# Patient Record
Sex: Female | Born: 1975 | Race: White | Hispanic: No | Smoking: Current every day smoker
Health system: Southern US, Community
[De-identification: ages and names within clinical notes are randomized; demographics above are authoritative.]

## PROBLEM LIST (undated history)

## (undated) DIAGNOSIS — M797 Fibromyalgia: Secondary | ICD-10-CM

## (undated) DIAGNOSIS — Q780 Osteogenesis imperfecta: Secondary | ICD-10-CM

## (undated) DIAGNOSIS — E282 Polycystic ovarian syndrome: Secondary | ICD-10-CM

## (undated) DIAGNOSIS — M8580 Other specified disorders of bone density and structure, unspecified site: Secondary | ICD-10-CM

## (undated) DIAGNOSIS — M419 Scoliosis, unspecified: Secondary | ICD-10-CM

## (undated) HISTORY — PX: CHOLECYSTECTOMY: SHX55

## (undated) HISTORY — PX: KNEE SURGERY: SHX244

---

## 1998-12-21 DIAGNOSIS — E669 Obesity, unspecified: Secondary | ICD-10-CM | POA: Insufficient documentation

## 2000-07-15 ENCOUNTER — Other Ambulatory Visit: Admission: RE | Admit: 2000-07-15 | Discharge: 2000-07-15 | Payer: Self-pay | Admitting: *Deleted

## 2002-03-23 ENCOUNTER — Emergency Department (HOSPITAL_COMMUNITY): Admission: EM | Admit: 2002-03-23 | Discharge: 2002-03-23 | Payer: Self-pay | Admitting: Emergency Medicine

## 2002-03-23 ENCOUNTER — Encounter: Payer: Self-pay | Admitting: Emergency Medicine

## 2003-01-10 ENCOUNTER — Inpatient Hospital Stay (HOSPITAL_COMMUNITY): Admission: AD | Admit: 2003-01-10 | Discharge: 2003-01-10 | Payer: Self-pay | Admitting: Obstetrics & Gynecology

## 2003-06-10 ENCOUNTER — Emergency Department (HOSPITAL_COMMUNITY): Admission: EM | Admit: 2003-06-10 | Discharge: 2003-06-11 | Payer: Self-pay

## 2004-12-20 DIAGNOSIS — M87059 Idiopathic aseptic necrosis of unspecified femur: Secondary | ICD-10-CM | POA: Insufficient documentation

## 2005-10-27 ENCOUNTER — Emergency Department (HOSPITAL_COMMUNITY): Admission: EM | Admit: 2005-10-27 | Discharge: 2005-10-27 | Payer: Self-pay | Admitting: Emergency Medicine

## 2005-12-06 ENCOUNTER — Emergency Department (HOSPITAL_COMMUNITY): Admission: EM | Admit: 2005-12-06 | Discharge: 2005-12-06 | Payer: Self-pay | Admitting: Emergency Medicine

## 2006-01-06 ENCOUNTER — Emergency Department (HOSPITAL_COMMUNITY): Admission: EM | Admit: 2006-01-06 | Discharge: 2006-01-07 | Payer: Self-pay | Admitting: Emergency Medicine

## 2006-01-12 ENCOUNTER — Ambulatory Visit (HOSPITAL_COMMUNITY): Admission: RE | Admit: 2006-01-12 | Discharge: 2006-01-12 | Payer: Self-pay | Admitting: Obstetrics & Gynecology

## 2006-01-12 ENCOUNTER — Encounter: Payer: Self-pay | Admitting: Vascular Surgery

## 2006-03-08 ENCOUNTER — Observation Stay (HOSPITAL_COMMUNITY): Admission: AD | Admit: 2006-03-08 | Discharge: 2006-03-09 | Payer: Self-pay | Admitting: Obstetrics & Gynecology

## 2006-03-11 ENCOUNTER — Inpatient Hospital Stay (HOSPITAL_COMMUNITY): Admission: AD | Admit: 2006-03-11 | Discharge: 2006-03-14 | Payer: Self-pay | Admitting: Obstetrics

## 2006-04-21 ENCOUNTER — Encounter: Admission: RE | Admit: 2006-04-21 | Discharge: 2006-07-01 | Payer: Self-pay | Admitting: Sports Medicine

## 2006-07-05 ENCOUNTER — Encounter: Admission: RE | Admit: 2006-07-05 | Discharge: 2006-07-05 | Payer: Self-pay | Admitting: Rheumatology

## 2006-07-12 ENCOUNTER — Encounter: Admission: RE | Admit: 2006-07-12 | Discharge: 2006-07-12 | Payer: Self-pay | Admitting: Rheumatology

## 2007-01-17 ENCOUNTER — Emergency Department (HOSPITAL_COMMUNITY): Admission: EM | Admit: 2007-01-17 | Discharge: 2007-01-17 | Payer: Self-pay | Admitting: Emergency Medicine

## 2007-01-19 ENCOUNTER — Emergency Department (HOSPITAL_COMMUNITY): Admission: EM | Admit: 2007-01-19 | Discharge: 2007-01-19 | Payer: Self-pay | Admitting: Emergency Medicine

## 2007-04-11 ENCOUNTER — Emergency Department (HOSPITAL_COMMUNITY): Admission: EM | Admit: 2007-04-11 | Discharge: 2007-04-11 | Payer: Self-pay | Admitting: Emergency Medicine

## 2007-11-27 ENCOUNTER — Emergency Department (HOSPITAL_COMMUNITY): Admission: EM | Admit: 2007-11-27 | Discharge: 2007-11-28 | Payer: Self-pay | Admitting: Emergency Medicine

## 2008-01-12 ENCOUNTER — Emergency Department (HOSPITAL_BASED_OUTPATIENT_CLINIC_OR_DEPARTMENT_OTHER): Admission: EM | Admit: 2008-01-12 | Discharge: 2008-01-12 | Payer: Self-pay | Admitting: Emergency Medicine

## 2008-05-28 ENCOUNTER — Emergency Department (HOSPITAL_BASED_OUTPATIENT_CLINIC_OR_DEPARTMENT_OTHER): Admission: EM | Admit: 2008-05-28 | Discharge: 2008-05-28 | Payer: Self-pay | Admitting: Emergency Medicine

## 2008-11-14 ENCOUNTER — Emergency Department (HOSPITAL_BASED_OUTPATIENT_CLINIC_OR_DEPARTMENT_OTHER): Admission: EM | Admit: 2008-11-14 | Discharge: 2008-11-14 | Payer: Self-pay | Admitting: Emergency Medicine

## 2009-01-14 ENCOUNTER — Emergency Department (HOSPITAL_BASED_OUTPATIENT_CLINIC_OR_DEPARTMENT_OTHER): Admission: EM | Admit: 2009-01-14 | Discharge: 2009-01-14 | Payer: Self-pay | Admitting: Emergency Medicine

## 2009-02-04 ENCOUNTER — Emergency Department (HOSPITAL_BASED_OUTPATIENT_CLINIC_OR_DEPARTMENT_OTHER): Admission: EM | Admit: 2009-02-04 | Discharge: 2009-02-04 | Payer: Self-pay | Admitting: Emergency Medicine

## 2009-02-27 ENCOUNTER — Ambulatory Visit: Payer: Self-pay | Admitting: Radiology

## 2009-02-27 ENCOUNTER — Emergency Department (HOSPITAL_BASED_OUTPATIENT_CLINIC_OR_DEPARTMENT_OTHER): Admission: EM | Admit: 2009-02-27 | Discharge: 2009-02-27 | Payer: Self-pay | Admitting: Emergency Medicine

## 2010-03-05 ENCOUNTER — Emergency Department (HOSPITAL_COMMUNITY): Admission: EM | Admit: 2010-03-05 | Discharge: 2010-03-06 | Payer: Self-pay | Admitting: Emergency Medicine

## 2010-04-08 ENCOUNTER — Emergency Department (HOSPITAL_BASED_OUTPATIENT_CLINIC_OR_DEPARTMENT_OTHER): Admission: EM | Admit: 2010-04-08 | Discharge: 2010-04-08 | Payer: Self-pay | Admitting: Emergency Medicine

## 2010-04-08 ENCOUNTER — Ambulatory Visit: Payer: Self-pay | Admitting: Diagnostic Radiology

## 2010-08-22 LAB — URINALYSIS, ROUTINE W REFLEX MICROSCOPIC
Nitrite: NEGATIVE
Protein, ur: NEGATIVE mg/dL
Specific Gravity, Urine: 1.024 (ref 1.005–1.030)
Urobilinogen, UA: 1 mg/dL (ref 0.0–1.0)

## 2010-08-22 LAB — URINE MICROSCOPIC-ADD ON

## 2010-08-22 LAB — PREGNANCY, URINE: Preg Test, Ur: NEGATIVE

## 2010-09-01 LAB — URINE MICROSCOPIC-ADD ON

## 2010-09-01 LAB — URINALYSIS, ROUTINE W REFLEX MICROSCOPIC
Glucose, UA: NEGATIVE mg/dL
Hgb urine dipstick: NEGATIVE
Ketones, ur: NEGATIVE mg/dL
pH: 5.5 (ref 5.0–8.0)

## 2011-02-12 LAB — POCT I-STAT, CHEM 8
Creatinine, Ser: 1.1
HCT: 38
Hemoglobin: 12.9
Sodium: 142
TCO2: 29

## 2011-02-12 LAB — URINALYSIS, ROUTINE W REFLEX MICROSCOPIC
Bilirubin Urine: NEGATIVE
Ketones, ur: NEGATIVE
Nitrite: NEGATIVE
Urobilinogen, UA: 1

## 2011-02-12 LAB — DIFFERENTIAL
Basophils Absolute: 0.3 — ABNORMAL HIGH
Basophils Relative: 2 — ABNORMAL HIGH
Eosinophils Absolute: 0.1
Eosinophils Relative: 1
Lymphs Abs: 3

## 2011-02-12 LAB — CBC
HCT: 35.9 — ABNORMAL LOW
MCHC: 34.8
MCV: 87.4
Platelets: 328
RDW: 12.5

## 2011-02-27 LAB — BASIC METABOLIC PANEL
Calcium: 8.9
GFR calc non Af Amer: >60
Glucose, Bld: 88
Sodium: 137

## 2011-02-27 LAB — DIFFERENTIAL
Basophils Absolute: 0
Lymphocytes Relative: 20
Monocytes Absolute: 0.5
Neutro Abs: 8.5 — ABNORMAL HIGH

## 2011-02-27 LAB — CBC
Hemoglobin: 12.8
Platelets: 343
RDW: 12.3

## 2011-05-14 ENCOUNTER — Encounter: Payer: Self-pay | Admitting: *Deleted

## 2011-05-14 ENCOUNTER — Emergency Department (INDEPENDENT_AMBULATORY_CARE_PROVIDER_SITE_OTHER): Payer: Medicaid Other

## 2011-05-14 ENCOUNTER — Emergency Department (HOSPITAL_BASED_OUTPATIENT_CLINIC_OR_DEPARTMENT_OTHER)
Admission: EM | Admit: 2011-05-14 | Discharge: 2011-05-14 | Disposition: A | Discharge status: Home / Self Care | Payer: Medicaid Other | Attending: Emergency Medicine | Admitting: Emergency Medicine

## 2011-05-14 DIAGNOSIS — R0602 Shortness of breath: Secondary | ICD-10-CM | POA: Insufficient documentation

## 2011-05-14 DIAGNOSIS — R071 Chest pain on breathing: Secondary | ICD-10-CM

## 2011-05-14 DIAGNOSIS — R079 Chest pain, unspecified: Secondary | ICD-10-CM | POA: Insufficient documentation

## 2011-05-14 DIAGNOSIS — M549 Dorsalgia, unspecified: Secondary | ICD-10-CM | POA: Insufficient documentation

## 2011-05-14 DIAGNOSIS — M7989 Other specified soft tissue disorders: Secondary | ICD-10-CM | POA: Insufficient documentation

## 2011-05-14 LAB — URINE MICROSCOPIC-ADD ON

## 2011-05-14 LAB — URINALYSIS, ROUTINE W REFLEX MICROSCOPIC
Leukocytes, UA: NEGATIVE
Nitrite: NEGATIVE
Specific Gravity, Urine: 1.027 (ref 1.005–1.030)
pH: 6 (ref 5.0–8.0)

## 2011-05-14 MED ORDER — OXYCODONE-ACETAMINOPHEN 5-325 MG PO TABS: 2.0000 | ORAL_TABLET | ORAL | Status: AC | PRN

## 2011-05-14 MED ORDER — OXYCODONE-ACETAMINOPHEN 5-325 MG PO TABS
2.0000 | ORAL_TABLET | Freq: Once | ORAL | Status: AC
  Administered 2011-05-14: 2 via ORAL
  Filled 2011-05-14: qty 2

## 2011-05-14 MED ORDER — IOHEXOL 350 MG/ML SOLN
80.0000 mL | Freq: Once | INTRAVENOUS | Status: AC | PRN
  Administered 2011-05-14: 80 mL via INTRAVENOUS

## 2011-05-14 NOTE — ED Notes (Signed)
C/o right -sided low back pain that started few days ago after sneezing. Pt is able to ambulate. Denies any urinary sxs.

## 2011-05-14 NOTE — ED Notes (Signed)
MD at bedside. 

## 2011-05-14 NOTE — ED Notes (Signed)
Patient transported to CT 

## 2011-05-14 NOTE — ED Provider Notes (Signed)
History     CSN: [id number]  Arrival date & time 05/14/11  0051   First MD Initiated Contact with Patient 05/14/11 0335      Chief Complaint  Patient presents with  . Back Pain    (Consider location/radiation/quality/duration/timing/severity/associated sxs/prior treatment) HPI Comments: Pt with pain to right upper back/lower chest for the last several days.  Initially started after sneezing.  Got worse tonight when she was carrying her baby.  Is worse with movement, but also worse with breathing and at times, feels SOB.  Had c-section about one month ago.  No calf pain.  Does have residual swelling to both legs, but has been steadily improving since surgery.  Patient is a 35 y.o. female presenting with back pain. The history is provided by the patient.  Back Pain  This is a new problem. The current episode started more than 2 days ago. The problem occurs constantly. The problem has been gradually worsening. The pain is associated with no known injury. Pertinent negatives include no chest pain, no fever, no numbness, no headaches, no abdominal pain and no weakness.    History reviewed. No pertinent past medical history.  History reviewed. No pertinent past surgical history.  History reviewed. No pertinent family history.  History  Substance Use Topics  . Smoking status: Never Smoker   . Smokeless tobacco: Not on file  . Alcohol Use: No    OB History    Grav Para Term Preterm Abortions TAB SAB Ect Mult Living                  Review of Systems  Constitutional: Negative for fever, chills, diaphoresis and fatigue.  HENT: Negative for congestion, rhinorrhea and sneezing.   Eyes: Negative.   Respiratory: Positive for shortness of breath. Negative for cough and chest tightness.   Cardiovascular: Positive for leg swelling. Negative for chest pain.  Gastrointestinal: Negative for nausea, vomiting, abdominal pain, diarrhea and blood in stool.  Genitourinary: Negative for  frequency, hematuria, flank pain and difficulty urinating.  Musculoskeletal: Positive for back pain. Negative for arthralgias.  Skin: Negative for rash.  Neurological: Negative for dizziness, speech difficulty, weakness, numbness and headaches.    Allergies  Penicillins  Home Medications   Current Outpatient Rx  Name Route Sig Dispense Refill  . OXYCODONE-ACETAMINOPHEN 5-325 MG PO TABS Oral Take 2 tablets by mouth every 4 (four) hours as needed for pain. 15 tablet 0    BP 130/99  Pulse 68  Temp(Src) 98.4 F (36.9 C) (Oral)  Resp 20  Ht 5\' 4"  (1.626 m)  Wt 250 lb (113.399 kg)  BMI 42.91 kg/m2  SpO2 100%  Physical Exam  Constitutional: She is oriented to person, place, and time. She appears well-developed and well-nourished.  HENT:  Head: Normocephalic and atraumatic.  Eyes: Pupils are equal, round, and reactive to light.  Neck: Normal range of motion. Neck supple.  Cardiovascular: Normal rate, regular rhythm and normal heart sounds.   Pulmonary/Chest: Effort normal and breath sounds normal. No respiratory distress. She has no wheezes. She has no rales. She exhibits tenderness.       Mild tenderness to palpation right lower posterior chest wall  Abdominal: Soft. Bowel sounds are normal. There is no tenderness. There is no rebound and no guarding.  Musculoskeletal: Normal range of motion. She exhibits no edema.  Lymphadenopathy:    She has no cervical adenopathy.  Neurological: She is alert and oriented to person, place, and time.  Skin: Skin is  warm and dry. No rash noted.  Psychiatric: She has a normal mood and affect.    ED Course  Procedures (including critical care time)  Results for orders placed during the hospital encounter of 05/14/11  URINALYSIS, ROUTINE W REFLEX MICROSCOPIC      Component Value Range   Color, Urine YELLOW  YELLOW    APPearance HAZY (*) CLEAR    Specific Gravity, Urine 1.027  1.005 - 1.030    pH 6.0  5.0 - 8.0    Glucose, UA NEGATIVE   NEGATIVE (mg/dL)   Hgb urine dipstick TRACE (*) NEGATIVE    Bilirubin Urine NEGATIVE  NEGATIVE    Ketones, ur NEGATIVE  NEGATIVE (mg/dL)   Protein, ur NEGATIVE  NEGATIVE (mg/dL)   Urobilinogen, UA 0.2  0.0 - 1.0 (mg/dL)   Nitrite NEGATIVE  NEGATIVE    Leukocytes, UA NEGATIVE  NEGATIVE   URINE MICROSCOPIC-ADD ON      Component Value Range   Squamous Epithelial / LPF FEW (*) RARE    WBC, UA 0-2  <3 (WBC/hpf)   RBC / HPF 0-2  <3 (RBC/hpf)   Bacteria, UA FEW (*) RARE   D-DIMER, QUANTITATIVE      Component Value Range   D-Dimer, Quant 1.53 (*) 0.00 - 0.48 (ug/mL-FEU)   Ct Angio Chest W/cm &/or Wo Cm  05/14/2011  *RADIOLOGY REPORT*  Clinical Data:  Pleuritic chest pain and elevated D-dimer.  CT ANGIOGRAPHY CHEST WITH CONTRAST  Technique:  Multidetector CT imaging of the chest was performed using the standard protocol during bolus administration of intravenous contrast.  Multiplanar CT image reconstructions including MIPs were obtained to evaluate the vascular anatomy.  Contrast: 80mL OMNIPAQUE IOHEXOL 350 MG/ML IV SOLN  Comparison:  None  Findings:  There is no evidence of pulmonary embolus.  The lungs are essentially clear bilaterally.  Minimal nodular density along the anterior aspect of the right upper lobe likely reflects atelectasis due to the overlying costochondral junction. There is no evidence of significant focal consolidation, pleural effusion or pneumothorax.  No masses are identified; no abnormal focal contrast enhancement is seen.  Trace pericardial fluid remains within normal limits.  The mediastinum is unremarkable in appearance.  No mediastinal lymphadenopathy is seen.  The great vessels are unremarkable in appearance.  No axillary lymphadenopathy is seen.  The visualized portions of the thyroid gland are unremarkable in appearance.  The visualized portions of the liver and spleen are unremarkable.  No acute osseous abnormalities are seen.  Review of the MIP images confirms the above  findings.  IMPRESSION: Unremarkable CTA of the chest.  Original Report Authenticated By: Tonia Ghent, M.D.     No results found.   Ct Angio Chest W/cm &/or Wo Cm  05/14/2011  *RADIOLOGY REPORT*  Clinical Data:  Pleuritic chest pain and elevated D-dimer.  CT ANGIOGRAPHY CHEST WITH CONTRAST  Technique:  Multidetector CT imaging of the chest was performed using the standard protocol during bolus administration of intravenous contrast.  Multiplanar CT image reconstructions including MIPs were obtained to evaluate the vascular anatomy.  Contrast: 80mL OMNIPAQUE IOHEXOL 350 MG/ML IV SOLN  Comparison:  None  Findings:  There is no evidence of pulmonary embolus.  The lungs are essentially clear bilaterally.  Minimal nodular density along the anterior aspect of the right upper lobe likely reflects atelectasis due to the overlying costochondral junction. There is no evidence of significant focal consolidation, pleural effusion or pneumothorax.  No masses are identified; no abnormal focal contrast enhancement is  seen.  Trace pericardial fluid remains within normal limits.  The mediastinum is unremarkable in appearance.  No mediastinal lymphadenopathy is seen.  The great vessels are unremarkable in appearance.  No axillary lymphadenopathy is seen.  The visualized portions of the thyroid gland are unremarkable in appearance.  The visualized portions of the liver and spleen are unremarkable.  No acute osseous abnormalities are seen.  Review of the MIP images confirms the above findings.  IMPRESSION: Unremarkable CTA of the chest.  Original Report Authenticated By: Tonia Ghent, M.D.     1. Back pain       MDM  Pt with upper right back/chest pain, sharp, pleuritic and worse with movement.  CTA neg for PE/other lung process.  Likely musculoskeletal.  Will tx with pain meds, f/u with PMD        Rolan Bucco, MD 05/14/11 [phone]

## 2011-12-19 ENCOUNTER — Emergency Department (HOSPITAL_BASED_OUTPATIENT_CLINIC_OR_DEPARTMENT_OTHER)
Admission: EM | Admit: 2011-12-19 | Discharge: 2011-12-19 | Disposition: A | Discharge status: Home / Self Care | Payer: BC Managed Care – PPO | Attending: Emergency Medicine | Admitting: Emergency Medicine

## 2011-12-19 ENCOUNTER — Encounter (HOSPITAL_BASED_OUTPATIENT_CLINIC_OR_DEPARTMENT_OTHER): Payer: Self-pay | Admitting: *Deleted

## 2011-12-19 ENCOUNTER — Emergency Department (HOSPITAL_BASED_OUTPATIENT_CLINIC_OR_DEPARTMENT_OTHER): Payer: BC Managed Care – PPO

## 2011-12-19 DIAGNOSIS — X58XXXA Exposure to other specified factors, initial encounter: Secondary | ICD-10-CM | POA: Insufficient documentation

## 2011-12-19 DIAGNOSIS — F17200 Nicotine dependence, unspecified, uncomplicated: Secondary | ICD-10-CM | POA: Insufficient documentation

## 2011-12-19 DIAGNOSIS — S93409A Sprain of unspecified ligament of unspecified ankle, initial encounter: Secondary | ICD-10-CM | POA: Insufficient documentation

## 2011-12-19 NOTE — ED Provider Notes (Addendum)
History     CSN: [id number]  Arrival date & time 12/19/11  1102   First MD Initiated Contact with Patient 12/19/11 1145      Chief Complaint  Patient presents with  . Ankle Injury    (Consider location/radiation/quality/duration/timing/severity/associated sxs/prior treatment) HPI Comments: The patient is a 36 year old woman who has had pain in her left ankle for about a week. 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 Had healed spontaneously. She is followed for her orthopedic problems by Marcene Corning M.D., and she has an appointment to be seen by a subspecialist at [facility] in the near future, concerning her left knee.  Patient is a 36 y.o. female presenting with ankle pain. The history is provided by the patient and medical records. No language interpreter was used.  Ankle Pain  The incident occurred more than 1 week ago. There was no injury mechanism. The pain is present in the left knee. The quality of the pain is described as aching. The pain is at a severity of 8/10. The pain is moderate. The pain has been fluctuating since onset. The symptoms are aggravated by bearing weight. Treatments tried: Crutches.    History reviewed. No pertinent past medical history.  History reviewed. No pertinent past surgical history.  No family history on file.  History  Substance Use Topics  . Smoking status: Current Everyday Smoker  . Smokeless tobacco: Not on file  . Alcohol Use: No    OB History    Grav Para Term Preterm Abortions TAB SAB Ect Mult Living                  Review of Systems  Constitutional: Negative for fever and chills.  Genitourinary: Negative.   Musculoskeletal:       Left ankle swelling.  Skin: Negative.   Neurological: Negative.   Psychiatric/Behavioral: Negative.     Allergies    Celebrex and Penicillins  Home Medications  No current outpatient prescriptions on file.  BP 138/79  Pulse 60  Temp 97.8 F (36.6 C) (Oral)  Resp 20  SpO2 100%  Physical Exam  ED Course  Procedures (including critical care time)  Labs Reviewed - No data to display Dg Ankle Complete Left  12/19/2011  *RADIOLOGY REPORT*  Clinical Data: The ankle injury  LEFT ANKLE COMPLETE - 3+ VIEW  Comparison: None  Findings: Mild diffuse soft tissue swelling.  There is mild osteopenia.  No fracture or subluxation identified.  No radiopaque foreign bodies or soft tissue calcifications.  IMPRESSION:  1.  No acute bone findings. 2.  Soft tissue swelling.  Original Report Authenticated By: Rosealee Albee, M.D.   DISP  ASO, crutches when up, ice and elevation at rest.  1. Sprain of ankle         Carleene Cooper III, MD 12/19/11 [id number]  Carleene Cooper III, MD 12/19/11 [phone]

## 2011-12-19 NOTE — ED Notes (Signed)
Left ankle swollen and painful. Denies injury.

## 2014-09-07 ENCOUNTER — Emergency Department (HOSPITAL_BASED_OUTPATIENT_CLINIC_OR_DEPARTMENT_OTHER)
Admission: EM | Admit: 2014-09-07 | Discharge: 2014-09-07 | Disposition: A | Discharge status: Home / Self Care | Payer: BLUE CROSS/BLUE SHIELD | Attending: Emergency Medicine | Admitting: Emergency Medicine

## 2014-09-07 ENCOUNTER — Encounter (HOSPITAL_BASED_OUTPATIENT_CLINIC_OR_DEPARTMENT_OTHER): Payer: Self-pay | Admitting: *Deleted

## 2014-09-07 DIAGNOSIS — Z880 Allergy status to penicillin: Secondary | ICD-10-CM | POA: Diagnosis not present

## 2014-09-07 DIAGNOSIS — N921 Excessive and frequent menstruation with irregular cycle: Secondary | ICD-10-CM | POA: Diagnosis not present

## 2014-09-07 DIAGNOSIS — R109 Unspecified abdominal pain: Secondary | ICD-10-CM | POA: Diagnosis present

## 2014-09-07 DIAGNOSIS — Z9049 Acquired absence of other specified parts of digestive tract: Secondary | ICD-10-CM | POA: Insufficient documentation

## 2014-09-07 DIAGNOSIS — Z3202 Encounter for pregnancy test, result negative: Secondary | ICD-10-CM | POA: Diagnosis not present

## 2014-09-07 DIAGNOSIS — Z720 Tobacco use: Secondary | ICD-10-CM | POA: Insufficient documentation

## 2014-09-07 DIAGNOSIS — R5383 Other fatigue: Secondary | ICD-10-CM

## 2014-09-07 DIAGNOSIS — Z8639 Personal history of other endocrine, nutritional and metabolic disease: Secondary | ICD-10-CM | POA: Insufficient documentation

## 2014-09-07 HISTORY — DX: Polycystic ovarian syndrome: E28.2

## 2014-09-07 LAB — CBC WITH DIFFERENTIAL/PLATELET
BASOS ABS: 0 10*3/uL (ref 0.0–0.1)
BASOS PCT: 0 % (ref 0–1)
EOS ABS: 0.1 10*3/uL (ref 0.0–0.7)
Eosinophils Relative: 1 % (ref 0–5)
HCT: 38.8 % (ref 36.0–46.0)
Hemoglobin: 13 g/dL (ref 12.0–15.0)
LYMPHS ABS: 2.8 10*3/uL (ref 0.7–4.0)
Lymphocytes Relative: 34 % (ref 12–46)
MCH: 30.7 pg (ref 26.0–34.0)
MCHC: 33.5 g/dL (ref 30.0–36.0)
MCV: 91.7 fL (ref 78.0–100.0)
Monocytes Absolute: 0.6 10*3/uL (ref 0.1–1.0)
Monocytes Relative: 7 % (ref 3–12)
NEUTROS PCT: 58 % (ref 43–77)
Neutro Abs: 4.8 10*3/uL (ref 1.7–7.7)
PLATELETS: 221 10*3/uL (ref 150–400)
RBC: 4.23 MIL/uL (ref 3.87–5.11)
RDW: 13 % (ref 11.5–15.5)
WBC: 8.3 10*3/uL (ref 4.0–10.5)

## 2014-09-07 LAB — URINALYSIS, ROUTINE W REFLEX MICROSCOPIC
BILIRUBIN URINE: NEGATIVE
Glucose, UA: NEGATIVE mg/dL
HGB URINE DIPSTICK: NEGATIVE
Ketones, ur: NEGATIVE mg/dL
Leukocytes, UA: NEGATIVE
Nitrite: NEGATIVE
Protein, ur: NEGATIVE mg/dL
Specific Gravity, Urine: 1.007 (ref 1.005–1.030)
Urobilinogen, UA: 0.2 mg/dL (ref 0.0–1.0)
pH: 6.5 (ref 5.0–8.0)

## 2014-09-07 LAB — PREGNANCY, URINE: PREG TEST UR: NEGATIVE

## 2014-09-07 MED ORDER — TRAMADOL HCL 50 MG PO TABS: 50.0000 mg | ORAL_TABLET | Freq: Four times a day (QID) | ORAL | Status: DC | PRN

## 2014-09-07 NOTE — ED Notes (Signed)
Abdominal pain for 10 months. POS. Abnormal vaginal bleeding.

## 2014-09-07 NOTE — Discharge Instructions (Signed)
Menorrhagia Menorrhagia is when your menstrual periods are heavy or last longer than usual.  HOME CARE  Only take medicine as told by your doctor.  Take any iron pills as told by your doctor. Heavy bleeding may cause low levels of iron in your body.  Do not take aspirin 1 week before or during your period. Aspirin can make the bleeding worse.  Lie down for a while if you change your tampon or pad more than once in 2 hours. This may help lessen the bleeding.  Eat a healthy diet and foods with iron. These foods include leafy green vegetables, meat, liver, eggs, and whole grain breads and cereals.  Do not try to lose weight. Wait until the heavy bleeding has stopped and your iron level is normal. GET HELP IF:  You soak through a pad or tampon every 1 or 2 hours, and this happens every time you have a period.  You need to use pads and tampons at the same time because you are bleeding so much.  You need to change your pad or tampon during the night.  You have a period that lasts for more than 8 days.  You pass clots bigger than 1 inch (2.5 cm) wide.  You have irregular periods that happen more or less often than once a month.  You feel dizzy or pass out (faint).  You feel very weak or tired.  You feel short of breath or feel your heart is beating too fast when you exercise.  You feel sick to your stomach (nausea) and you throw up (vomit) while you are taking your medicine.   You have watery poop (diarrhea) while you are taking your medicine.  You have any problems that may be related to the medicine you are taking.  GET HELP RIGHT AWAY IF:  You soak through 4 or more pads or tampons in 2 hours.  You have any bleeding while you are pregnant. MAKE SURE YOU:   Understand these instructions.  Will watch your condition.  Will get help right away if you are not doing well or get worse. Document Released: 02/11/2008 Document Revised: 01/04/2013 Document Reviewed:  11/03/2012 [facility] Patient Information 2015 [place], [place]. This information is not intended to replace advice given to you by your health care provider. Make sure you discuss any questions you have with your health care provider.  Emergency Department Resource Guide 1) Find a Doctor and Pay Out of Pocket Although you won't have to find out who is covered by your insurance plan, it is a good idea to ask around and get recommendations. You will then need to call the office and see if the doctor you have chosen will accept you as a new patient and what types of options they offer for patients who are self-pay. Some doctors offer discounts or will set up payment plans for their patients who do not have insurance, but you will need to ask so you aren't surprised when you get to your appointment.  2) Contact Your Local Health Department Not all health departments have doctors that can see patients for sick visits, but many do, so it is worth a call to see if yours does. If you don't know where your local health department is, you can check in your phone book. The CDC also has a tool to help you locate your state's health department, and many state websites also have listings of all of their local health departments.  3) Find a [employer]  If your illness is not likely to be very severe or complicated, you may want to try a walk in clinic. These are popping up all over the country in pharmacies, drugstores, and shopping centers. They're usually staffed by nurse practitioners or physician assistants that have been trained to treat common illnesses and complaints. They're usually fairly quick and inexpensive. However, if you have serious medical issues or chronic medical problems, these are probably not your best option.  No Primary Care Doctor: - Call Health Connect at  [phone] - they can help you locate a primary care doctor that  accepts your insurance, provides certain services, etc. - Physician  Referral Service- [phone]  Chronic Pain Problems: Organization         Address  Phone   Notes  [facility]  [phone] Patients need to be referred by their primary care doctor.   Medication Assistance: Organization         Address  Phone   Notes  [facility] Medication [facility] [address] [place] [place], [place] [address] [phone] --Must be a resident of [facility] -- Must have NO insurance coverage whatsoever (no Medicaid/ Medicare, etc.) -- The pt. MUST have a primary care doctor that directs their care regularly and follows them in the community   [facility]  [phone]   [employer]  [phone]    Agencies that provide inexpensive medical care: Organization         Address  Phone   Notes  [facility]  [phone]   [facility]    [phone]   [facility] [address] [place], [place] [address] [phone]   [facility] [address], [place] [phone]   Planned Parenthood    [phone]   [facility]    [phone]   [facility] and [facility]  [address], [place] Phone:  [phone], Fax:  [phone] Hours of Operation:  9 am - 6 pm, M-F.  Also accepts Medicaid/Medicare and self-pay.  [facility]  [address], [place] Phone: [phone], Fax: [phone]. Hours of Operation:  8:30 am - 5:30 pm, M-F.  Also accepts Medicaid and self-pay.  [facility] [place] [address], [place] Phone: [phone]   [facility] [address] Natasha Bence [place], [place] [phone], Ext. [phone] Mondays & Thursdays: 7-9 AM.  First 15 patients are seen on a first come, first serve basis.    Medicaid-accepting [facility] Providers:  Organization         Address  Phone   Notes  [facility] [address],  [phone] Also accepts self-pay patients.  [facility] [address] Laurell Josephs [place], [place]  [phone]   [facility] [address], [place] [phone]   [facility] [address], [place] [phone]   Renaye Rakers [address], [place]   [phone] Only accepts [place] patients after they have their name applied to their card.   Self-Pay (no insurance) in [facility]:  Organization         Address  Phone   Notes  Sickle Cell Patients, [facility] [address] [place], [place] [phone])  [phone]   [facility] [address] [place], [place] [phone]   [facility] [place]  [address], [place] [phone]   [facility]/Dr. Osei-Bonsu  [address] [place] or [address], [place] [phone] Phone number for both [place] and [place] locations is the same.  [facility] [address], [employer] [employer] [phone]   [facility] [address] [place] or [address] [phone]   [facility] [address] [place] [phone], phone; [phone], fax Sees patients 1st and 3rd Saturday of every month.  Must not qualify for public or private insurance (i.e. Medicaid, Medicare, [place] Health Choice, Veterans' Benefits)  Household income should be no more than 200% of the poverty level The clinic cannot treat you if you are pregnant or think you are pregnant  Sexually transmitted diseases are not treated at the clinic.    Dental Care: Organization         Address  Phone  Notes  [facility] Department of [facility] [address] [place], [place] [phone] Accepts children up to age 65 who are enrolled  in [place] or [place] Health Choice; pregnant women with a Medicaid card; and children who have applied for Medicaid or [place] Health Choice, but were declined, whose parents can pay a reduced fee at time of service.  [facility] Department of [facility]  [address], [place] [phone] Accepts children up to age 53 who are enrolled in [place] or [place] Health Choice; pregnant women with a Medicaid card; and children who have applied for Medicaid or [place] Health Choice, but were declined, whose parents can pay a reduced fee at time of service.  [facility]  [address] [place], [place] [phone] Patients are seen by appointment only. Walk-ins are not accepted. [facility] will see patients 33 years of age and older. Monday - Tuesday (8am-5pm) Most Wednesdays (8:30-5pm) $30 per visit, cash only  [facility]  [address], [facility] [phone] Patients are seen by appointment only. Walk-ins are not accepted. [facility] will see patients 20 years of age and older. One Wednesday Evening (Monthly: Volunteer Based).  $30 per visit, cash only  [employer]  [phone] for adults; Children under age 51, call Graduate Pediatric Dentistry at [phone]. Children aged 53-14, please call [phone] to request a pediatric application.  Dental services are provided in all areas of dental care including fillings, crowns and bridges, complete and partial dentures, implants, gum treatment, root canals, and extractions. Preventive care is also provided. Treatment is provided to both adults and children. Patients are selected via a lottery and there is often a waiting list.   [facility] [address] [place]  [phone] [email or web address]   [facility] [address] [place], [place] [phone], Ext. [phone] Second and Fourth Thursday of each month, opens at  6:30 AM; Clinic ends at 9 AM.  Patients are seen on a first-come first-served basis, and a limited number are seen during each clinic.   [facility]  [address] Ether Griffins [place], [place] [phone]   Eligibility Requirements You must have lived in [place], [place], or [place] counties for  at least the last three months.   You cannot be eligible for state or federal sponsored healthcare insurance, including CIGNAVeterans Administration, [place]icaid, or [employer]icare.   You generally cannot be eligible for healthcare insurance through your employer.    How to apply: Eligibility screenings are held every Tuesday and Wednesday afternoon from 1:00 pm until 4:00 pm. You do not need an appointment for the interview!  [facility] [address], [place], [place] [phone]   [facility]  [phone]   [facility]  [phone]   [facility]  [phone]    Behavioral Health Resources in the Community: Intensive Outpatient Programs Organization         Address  Phone  Notes  [facility] [address], [place], [place] [phone]   [facility] [address], [place], [place] [phone]   [facility] [address], [place], [place]  [phone]   [facility] [address],  [place], [place] [phone] or [phone]   Substance Abuse Resources Organization         Address  Phone  Notes  [facility] [place] [facility]  [phone]   [facility]  [phone]   [facility]  [phone]   [facility]  [phone]   Residential & Outpatient Substance Abuse Program  [phone]   Psychological Services Organization         Address  Phone  Notes  [facility]  [phone]- [phone]   [facility]  [phone]- [phone]   [facility] [address], [place]  [phone] or [phone]    Mobile Crisis Teams Organization         Address  Phone  Notes  [facility], Mobile Crisis Care Unit  [phone]   [facility]  [address] [place], [place] [phone]   Doristine LocksSharon DeEsch [address] [place] [phone]    Self-Help/Support Groups Organization         Address  Phone             Notes  [facility] [place] - variety of support groups  336- [phone] Call for more information  Narcotics Anonymous (NA), [facility] [address], [place]  2 meetings at this location   Statisticianesidential Treatment Programs Organization         Address  Phone  Notes  [facility] [address],    [place]  [phone]   [facility]  [address], [place] [phone], [place], [place] [phone]   [facility] [address], [place] [phone] Admissions: 8am-3pm M-F  [facility] [address]    [place], [place] [phone]   [facility] [address], [place], [place] [phone]   [facility] [address]  [place], [place] [phone]   [facility] - Intensive Outpatient [address], [place], [place] [phone]   [facility] ([facility] [address]  [place], [place] [phone] or [phone]   [facility] ([facility]) [address] [place], [place] [phone] Accepts Medicaid  [facility] [address]  [place] [phone] Substance Abuse/Addiction Treatment   [facility] Resources Organization         Address  Phone  Notes  [facility]  [phone]  Angie Fava, PhD [address] Ervin Knack [place], [place]   [phone] or [phone]   [facility]   [address] [place], [place] [phone]   [facility] Recovery  [address], [place], [place] [phone] Insurance/Medicaid/sponsorship through [facility] and Families [address]                                    [place], [place] [phone] Therapy/tele-psych/case  [facility] [address], [place] [phone]    Dr. Lolly Mustache  [phone]   [facility]  [employer] [facility] 1) [address] [place] 2) [address], [place] 3)  [address], [place] [phone]  [phone]   [facility] Child Abuse Hotline (404) 791-9112 or [phone] (After Hours)

## 2014-09-07 NOTE — ED Provider Notes (Signed)
CSN: [id number]     Arrival date & time 09/07/14  1829 History   First MD Initiated Contact with Patient 09/07/14 1858     Chief Complaint  Patient presents with  . Abdominal Pain     (Consider location/radiation/quality/duration/timing/severity/associated sxs/prior Treatment) HPI Comments: 39 yo female with a history of PCOS and irregular menstrual cycles presenting with a 3 month history of menorrhagia, intermittent abdominal cramping and fatigue.  She was diagnosed with PCOS 2 years ago by her PCP and took Metformin x 1 week to initiate menstrual cycle.  States she only took Metformin for 1 week and has had regular cycles x 2 years and developed heavy vaginal bleeding and irregular cycles 3 months ago. Reports vaginal bleeding is heavy at times and passes clots. Minimal vaginal bleeding presently. Denies history of anemia. Denies SOB, chest pain, dysuria, or fevers.  Patient is a 39 y.o. female presenting with abdominal pain.  Abdominal Pain   Past Medical History  Diagnosis Date  . Polycystic ovarian syndrome    Past Surgical History  Procedure Laterality Date  . Cholecystectomy    . Knee surgery     No family history on file. History  Substance Use Topics  . Smoking status: Current Every Day Smoker -- 1.00 packs/day    Types: Cigarettes  . Smokeless tobacco: Not on file  . Alcohol Use: No   OB History    No data available     Review of Systems  Gastrointestinal: Positive for abdominal pain.  All other systems reviewed and are negative.     Allergies  Celebrex and Penicillins  Home Medications   Prior to Admission medications   Not on File   BP 145/78 mmHg  Pulse 74  Temp(Src) 98.2 F (36.8 C) (Oral)  Resp 18  Ht 5\' 3"  (1.6 m)  Wt 198 lb (89.812 kg)  BMI 35.08 kg/m2  SpO2 100% Physical Exam  Constitutional: She is oriented to person, place, and time. She appears well-developed and well-nourished. No distress.  HENT:  Head: Normocephalic and  atraumatic.  Eyes: EOM are normal.  Pale conjunctiva  Neck: Neck supple.  Cardiovascular: Normal rate, regular rhythm and normal heart sounds.   No murmur heard. Pulmonary/Chest: Effort normal and breath sounds normal. No respiratory distress. She has no wheezes.  Abdominal: Bowel sounds are normal. She exhibits no distension and no mass. There is no tenderness. There is no guarding.  Genitourinary: There is bleeding in the vagina. No erythema or tenderness in the vagina.  Scant amount blood in vaginal vault. No CMT or adnexal tenderness.  Musculoskeletal: Normal range of motion.  Lymphadenopathy:    She has no cervical adenopathy.  Neurological: She is alert and oriented to person, place, and time.  Skin: Skin is warm and dry.  Psychiatric: She has a normal mood and affect.    ED Course  Pelvic exam Date/Time: 09/07/2014 7:56 PM Performed by: Teressa LowerPICKERING, Amauris Debois Authorized by: Teressa LowerPICKERING, Duc Crocket Consent: Verbal consent obtained. Written consent not obtained. Risks and benefits: risks, benefits and alternatives were discussed Consent given by: patient Patient understanding: patient states understanding of the procedure being performed Patient identity confirmed: verbally with patient   (including critical care time) Labs Review Labs Reviewed  URINALYSIS, ROUTINE W REFLEX MICROSCOPIC  PREGNANCY, URINE  CBC WITH DIFFERENTIAL/PLATELET    Imaging Review No results found.   EKG Interpretation None      MDM   Final diagnoses:  Menorrhagia with irregular cycle   Considered anemia based  on menorrhagia and fatigue- Labs unremarkable.  No imaging necessary at this time based on PE findings.  Discussed GYN and PCP follow up to further discuss vaginal bleeding.    Teressa Lower, NP 09/07/14 2039  Gwyneth Sprout, MD 09/07/14 [phone]

## 2015-06-04 ENCOUNTER — Encounter: Payer: Self-pay | Admitting: Family Medicine

## 2015-06-04 ENCOUNTER — Ambulatory Visit (INDEPENDENT_AMBULATORY_CARE_PROVIDER_SITE_OTHER): Payer: BLUE CROSS/BLUE SHIELD | Admitting: Family Medicine

## 2015-06-04 VITALS — BP 134/84 | HR 72 | Temp 98.0°F | Resp 16 | Ht 63.0 in | Wt 230.0 lb

## 2015-06-04 DIAGNOSIS — M797 Fibromyalgia: Secondary | ICD-10-CM

## 2015-06-04 DIAGNOSIS — Z720 Tobacco use: Secondary | ICD-10-CM

## 2015-06-04 DIAGNOSIS — Z0000 Encounter for general adult medical examination without abnormal findings: Secondary | ICD-10-CM | POA: Diagnosis not present

## 2015-06-04 DIAGNOSIS — R5382 Chronic fatigue, unspecified: Secondary | ICD-10-CM | POA: Diagnosis not present

## 2015-06-04 DIAGNOSIS — E282 Polycystic ovarian syndrome: Secondary | ICD-10-CM | POA: Diagnosis not present

## 2015-06-04 DIAGNOSIS — Z124 Encounter for screening for malignant neoplasm of cervix: Secondary | ICD-10-CM | POA: Diagnosis not present

## 2015-06-04 DIAGNOSIS — J309 Allergic rhinitis, unspecified: Secondary | ICD-10-CM | POA: Diagnosis not present

## 2015-06-04 DIAGNOSIS — Z23 Encounter for immunization: Secondary | ICD-10-CM | POA: Diagnosis not present

## 2015-06-04 LAB — COMPLETE METABOLIC PANEL WITH GFR
ALBUMIN: 3.9 g/dL (ref 3.6–5.1)
ALT: 16 U/L (ref 6–29)
AST: 18 U/L (ref 10–30)
Alkaline Phosphatase: 59 U/L (ref 33–115)
BILIRUBIN TOTAL: 0.3 mg/dL (ref 0.2–1.2)
BUN: 10 mg/dL (ref 7–25)
CALCIUM: 8.2 mg/dL — AB (ref 8.6–10.2)
CO2: 26 mmol/L (ref 20–31)
Chloride: 105 mmol/L (ref 98–110)
Creat: 0.74 mg/dL (ref 0.50–1.10)
Glucose, Bld: 64 mg/dL — ABNORMAL LOW (ref 65–99)
Potassium: 4.1 mmol/L (ref 3.5–5.3)
Sodium: 140 mmol/L (ref 135–146)
TOTAL PROTEIN: 6 g/dL — AB (ref 6.1–8.1)

## 2015-06-04 LAB — CBC
HCT: 38.3 % (ref 36.0–46.0)
Hemoglobin: 12.9 g/dL (ref 12.0–15.0)
MCH: 30.1 pg (ref 26.0–34.0)
MCHC: 33.7 g/dL (ref 30.0–36.0)
MCV: 89.5 fL (ref 78.0–100.0)
MPV: 9.8 fL (ref 8.6–12.4)
PLATELETS: 314 10*3/uL (ref 150–400)
RBC: 4.28 MIL/uL (ref 3.87–5.11)
RDW: 13.5 % (ref 11.5–15.5)
WBC: 7.6 10*3/uL (ref 4.0–10.5)

## 2015-06-04 LAB — LIPID PANEL
Cholesterol: 142 mg/dL (ref 125–200)
HDL: 38 mg/dL — ABNORMAL LOW (ref >=46)
LDL Cholesterol: 71 mg/dL (ref <130)
Total CHOL/HDL Ratio: 3.7 ratio (ref <=5.0)
Triglycerides: 164 mg/dL — ABNORMAL HIGH (ref <150)
VLDL: 33 mg/dL — ABNORMAL HIGH (ref <30)

## 2015-06-04 MED ORDER — GABAPENTIN 100 MG PO CAPS: 100.0000 mg | ORAL_CAPSULE | Freq: Two times a day (BID) | ORAL | Status: DC

## 2015-06-04 MED ORDER — CYCLOBENZAPRINE HCL 10 MG PO TABS: 10.0000 mg | ORAL_TABLET | Freq: Every evening | ORAL | Status: DC | PRN

## 2015-06-04 MED ORDER — FLUTICASONE PROPIONATE 50 MCG/ACT NA SUSP: 2.0000 | Freq: Every day | NASAL | Status: DC

## 2015-06-04 NOTE — Patient Instructions (Addendum)
Please take one gabapentin at bedtime for 1 week, then take 1 tablet twice a day Please get some over the counter Zyrtec (generic fine) and take daily  Allergic Rhinitis Allergic rhinitis is when the mucous membranes in the nose respond to allergens. Allergens are particles in the air that cause your body to have an allergic reaction. This causes you to release allergic antibodies. Through a chain of events, these eventually cause you to release histamine into the blood stream. Although meant to protect the body, it is this release of histamine that causes your discomfort, such as frequent sneezing, congestion, and an itchy, runny nose.  CAUSES Seasonal allergic rhinitis (hay fever) is caused by pollen allergens that may come from grasses, trees, and weeds. Year-round allergic rhinitis (perennial allergic rhinitis) is caused by allergens such as house dust mites, pet dander, and mold spores. SYMPTOMS  Nasal stuffiness (congestion).  Itchy, runny nose with sneezing and tearing of the eyes. DIAGNOSIS Your health care provider can help you determine the allergen or allergens that trigger your symptoms. If you and your health care provider are unable to determine the allergen, skin or blood testing may be used. Your health care provider will diagnose your condition after taking your health history and performing a physical exam. Your health care provider may assess you for other related conditions, such as asthma, pink eye, or an ear infection. TREATMENT Allergic rhinitis does not have a cure, but it can be controlled by:  Medicines that block allergy symptoms. These may include allergy shots, nasal sprays, and oral antihistamines.  Avoiding the allergen. Hay fever may often be treated with antihistamines in pill or nasal spray forms. Antihistamines block the effects of histamine. There are over-the-counter medicines that may help with nasal congestion and swelling around the eyes. Check with your  health care provider before taking or giving this medicine. If avoiding the allergen or the medicine prescribed do not work, there are many new medicines your health care provider can prescribe. Stronger medicine may be used if initial measures are ineffective. Desensitizing injections can be used if medicine and avoidance does not work. Desensitization is when a patient is given ongoing shots until the body becomes less sensitive to the allergen. Make sure you follow up with your health care provider if problems continue. HOME CARE INSTRUCTIONS It is not possible to completely avoid allergens, but you can reduce your symptoms by taking steps to limit your exposure to them. It helps to know exactly what you are allergic to so that you can avoid your specific triggers. SEEK MEDICAL CARE IF:  You have a fever.  You develop a cough that does not stop easily (persistent).  You have shortness of breath.  You start wheezing.  Symptoms interfere with normal daily activities.   This information is not intended to replace advice given to you by your health care provider. Make sure you discuss any questions you have with your health care provider.   Document Released: 01/27/2001 Document Revised: 05/25/2014 Document Reviewed: 01/09/2013 Elsevier Interactive Patient Education 2016 [employer]. Smoking Cessation, Tips for Success If you are ready to quit smoking, congratulations! You have chosen to help yourself be healthier. Cigarettes bring nicotine, tar, carbon monoxide, and other irritants into your body. Your lungs, heart, and blood vessels will be able to work better without these poisons. There are many different ways to quit smoking. Nicotine gum, nicotine patches, a nicotine inhaler, or nicotine nasal spray can help with physical craving. Hypnosis, support  groups, and medicines help break the habit of smoking. WHAT THINGS CAN I DO TO MAKE QUITTING EASIER?  Here are some tips to help you quit  for good:  Pick a date when you will quit smoking completely. Tell all of your friends and family about your plan to quit on that date.  Do not try to slowly cut down on the number of cigarettes you are smoking. Pick a quit date and quit smoking completely starting on that day.  Throw away all cigarettes.   Clean and remove all ashtrays from your home, work, and car.  On a card, write down your reasons for quitting. Carry the card with you and read it when you get the urge to smoke.  Cleanse your body of nicotine. Drink enough water and fluids to keep your urine clear or pale yellow. Do this after quitting to flush the nicotine from your body.  Learn to predict your moods. Do not let a bad situation be your excuse to have a cigarette. Some situations in your life might tempt you into wanting a cigarette.  Never have "just one" cigarette. It leads to wanting another and another. Remind yourself of your decision to quit.  Change habits associated with smoking. If you smoked while driving or when feeling stressed, try other activities to replace smoking. Stand up when drinking your coffee. Brush your teeth after eating. Sit in a different chair when you read the paper. Avoid alcohol while trying to quit, and try to drink fewer caffeinated beverages. Alcohol and caffeine may urge you to smoke.  Avoid foods and drinks that can trigger a desire to smoke, such as sugary or spicy foods and alcohol.  Ask people who smoke not to smoke around you.  Have something planned to do right after eating or having a cup of coffee. For example, plan to take a walk or exercise.  Try a relaxation exercise to calm you down and decrease your stress. Remember, you may be tense and nervous for the first 2 weeks after you quit, but this will pass.  Find new activities to keep your hands busy. Play with a pen, coin, or rubber band. Doodle or draw things on paper.  Brush your teeth right after eating. This will  help cut down on the craving for the taste of tobacco after meals. You can also try mouthwash.   Use oral substitutes in place of cigarettes. Try using lemon drops, carrots, cinnamon sticks, or chewing gum. Keep them handy so they are available when you have the urge to smoke.  When you have the urge to smoke, try deep breathing.  Designate your home as a nonsmoking area.  If you are a heavy smoker, ask your health care provider about a prescription for nicotine chewing gum. It can ease your withdrawal from nicotine.  Reward yourself. Set aside the cigarette money you save and buy yourself something nice.  Look for support from others. Join a support group or smoking cessation program. Ask someone at home or at work to help you with your plan to quit smoking.  Always ask yourself, "Do I need this cigarette or is this just a reflex?" Tell yourself, "Today, I choose not to smoke," or "I do not want to smoke." You are reminding yourself of your decision to quit.  Do not replace cigarette smoking with electronic cigarettes (commonly called e-cigarettes). The safety of e-cigarettes is unknown, and some may contain harmful chemicals.  If you relapse, do not give  up! Plan ahead and think about what you will do the next time you get the urge to smoke. HOW WILL I FEEL WHEN I QUIT SMOKING? You may have symptoms of withdrawal because your body is used to nicotine (the addictive substance in cigarettes). You may crave cigarettes, be irritable, feel very hungry, cough often, get headaches, or have difficulty concentrating. The withdrawal symptoms are only temporary. They are strongest when you first quit but will go away within 10-14 days. When withdrawal symptoms occur, stay in control. Think about your reasons for quitting. Remind yourself that these are signs that your body is healing and getting used to being without cigarettes. Remember that withdrawal symptoms are easier to treat than the major  diseases that smoking can cause.  Even after the withdrawal is over, expect periodic urges to smoke. However, these cravings are generally short lived and will go away whether you smoke or not. Do not smoke! WHAT RESOURCES ARE AVAILABLE TO HELP ME QUIT SMOKING? Your health care provider can direct you to community resources or hospitals for support, which may include:  Group support.  Education.  Hypnosis.  Therapy.   This information is not intended to replace advice given to you by your health care provider. Make sure you discuss any questions you have with your health care provider.   Document Released: 01/31/2004 Document Revised: 05/25/2014 Document Reviewed: 10/20/2012 Elsevier Interactive Patient Education [employer]

## 2015-06-04 NOTE — Progress Notes (Signed)
Subjective:    Patient ID: Lindsay Norman, female    DOB: Jun 24, 1975, 40 y.o.   MRN: [id number]  HPI This is a pleasant 40 yo female who presents today for CPE and to establish care. She is accompanied by her mother who is a patient of mine and her preschool aged daughter. She is a single mother and has two daughters. She works in Set designer at [employer]. She was previously seen at [facility] for primary care.   Last CPE- several years Pap- two years ago she thinks Tdap- unsure, will have today Flu- never Eye- not regular Dental- not regular Exercise- not regular  She has a diagnosis of PCOS and has heavy periods. Was on metformin for awhile.   She has a history of fibromyalgia and has tried lyrica and gabapentin. She thinks she got some relief with gabapentin. She was doing better and stopped her medication. She works in Set designer at [employer]. She is on her feet all day and does heavy lifting. She works 11 hour days, 5-6 days a week. Her work place is currently on mandatory overtime. She requests a letter granting her work restrictions to restrict her to working 8 hours maximum ina day. She has had more pain in her hips and back and numbness in her feet. 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 She continues to smoke.   She is tired all of the time between her work schedule and household responsibilities. She does not sleep well due to pain and anxiety. She has gained a great deal of weight. She drinks 4-6 regular sodas a day and her eating schedule is very erratic due to her long work hours.   Past Medical History  Diagnosis Date  . Polycystic ovarian syndrome    Past Surgical History  Procedure Laterality Date  . Cholecystectomy    . Knee surgery     Family History  Problem Relation Age of Onset  . Fibromyalgia Mother   . Hypertension Mother   . Depression Mother   . Cancer Father     colon cancer  . Osteoarthritis Father   . Depression  Brother    Social History  Substance Use Topics  . Smoking status: Current Every Day Smoker -- 1.00 packs/day    Types: Cigarettes  . Smokeless tobacco: None  . Alcohol Use: No      Review of Systems  Constitutional: Positive for fatigue.  HENT: Positive for congestion and sinus pressure.   Eyes: Negative.   Respiratory: Positive for cough, shortness of breath and wheezing.   Cardiovascular: Negative.   Gastrointestinal: Positive for abdominal distention.  Endocrine: Negative.   Genitourinary: Negative.   Musculoskeletal: Positive for myalgias, back pain and arthralgias.  Skin: Negative.   Allergic/Immunologic: Positive for environmental allergies.  Neurological: Positive for numbness and headaches.  Psychiatric/Behavioral: Positive for dysphoric mood, decreased concentration and agitation. The patient is nervous/anxious.        Objective:   Physical Exam Physical Exam  Constitutional: She is oriented to person, place, and time. She appears well-developed and well-nourished. No distress. Obese. HENT:  Head: Normocephalic and atraumatic.  Right Ear: External ear normal.  Left Ear: External ear normal.  Nose: Rhinorrhea and swollen nasal mucosa Mouth/Throat: Oropharynx is clear and moist. No oropharyngeal exudate.  Eyes: Conjunctivae are normal. Pupils are equal, round, and reactive to light.  Neck: Normal range of motion. Neck supple. No JVD present. No thyromegaly present.  Cardiovascular: Normal rate, regular rhythm,  normal heart sounds and intact distal pulses.   Pulmonary/Chest: Effort normal and breath sounds normal. Right breast exhibits no inverted nipple, no mass, no nipple discharge, no skin change and no tenderness. Left breast exhibits no inverted nipple, no mass, no nipple discharge, no skin change and no tenderness. Breasts are symmetrical.  Abdominal: Soft. Bowel sounds are normal. She exhibits no distension and no mass. There is no tenderness. There is no  rebound and no guarding.  Genitourinary: Vagina normal. Pelvic exam was performed with patient supine. There is no rash, tenderness, lesion or injury on the right labia. There is no rash, tenderness, lesion or injury on the left labia. Cervix exhibits no motion tenderness and no discharge. No vaginal discharge found.  Musculoskeletal: Normal range of motion. She has generalized tenderness of back muscles, no point tenderness.  Lymphadenopathy:    She has no cervical adenopathy.  Neurological: She is alert and oriented to person, place, and time. She has normal reflexes.  Skin: Skin is warm and dry. She is not diaphoretic.  Psychiatric: She has a normal mood and affect. Her behavior is normal. Judgment and thought content normal.  Vitals reviewed.  BP 134/84 mmHg  Pulse 72  Temp(Src) 98 F (36.7 C)  Resp 16  Ht  (1.6 m)  Wt 230 lb (104.327 kg)  BMI 40.75 kg/m2  LMP 05/28/2015 Wt Readings from Last 3 Encounters:  06/04/15 230 lb (104.327 kg)  09/07/14 198 lb (89.812 kg)  05/14/11 250 lb (113.399 kg)      Assessment & Plan:  1. Annual physical exam   2. Allergic rhinitis, unspecified allergic rhinitis type - Provided written and verbal information regarding diagnosis and treatment. - encouraged OTC long acting antihistamine such as cetirizine. - fluticasone (FLONASE) 50 MCG/ACT nasal spray; Place 2 sprays into both nostrils daily.  Dispense: 16 g; Refill: 6  3. Fibromyalgia - I explained that I am unable to provide work restrictions without reviewing her records. She will request records from previous PCP - gabapentin (NEURONTIN) 100 MG capsule; Take 1 capsule (100 mg total) by mouth 2 (two) times daily.  Dispense: 60 capsule; Refill: 3 - cyclobenzaprine (FLEXERIL) 10 MG tablet; Take 1 tablet (10 mg total) by mouth at bedtime as needed for muscle spasms.  Dispense: 30 tablet; Refill: 1 - discussed nature of illness and importance of sleep, regular exercise and stress  management.  4. Morbid obesity due to excess calories (HCC) - encouraged patient to eliminate soda and sweet tea consumption, discussed basic meal planning, regular meals and snacks and increasing vegetable and fruit consumption.  - Lipid panel - TSH - COMPLETE METABOLIC PANEL WITH GFR  5. Chronic fatigue - discussed pacing herself and using her energy wisely, importance of restorative sleep - CBC - TSH  6. PCOS (polycystic ovarian syndrome) - COMPLETE METABOLIC PANEL WITH GFR  7. Need for Tdap vaccination - Tdap vaccine greater than or equal to 7yo IM  8. Screening for cervical cancer - Pap IG, CT/NG w/ reflex HPV when ASC-U  9. Tobacco abuse - Discussed importance of smoking cessation, especially given her history of avascular necrosis of both hips - provided written tips for success  - follow up in 1 month  Olean Ree, FNP-BC  [facility], [facility]  06/07/2015 8:35 AM

## 2015-06-05 LAB — PAP IG, CT-NG, RFX HPV ASCU
CHLAMYDIA PROBE AMP: NOT DETECTED
GC Probe Amp: NOT DETECTED

## 2015-06-05 LAB — TSH: TSH: 1.703 u[IU]/mL (ref 0.350–4.500)

## 2015-06-07 ENCOUNTER — Encounter: Payer: Self-pay | Admitting: Family Medicine

## 2015-07-04 ENCOUNTER — Ambulatory Visit (INDEPENDENT_AMBULATORY_CARE_PROVIDER_SITE_OTHER): Payer: BLUE CROSS/BLUE SHIELD | Admitting: Family Medicine

## 2015-07-04 VITALS — BP 138/86 | HR 84 | Temp 97.9°F | Resp 16 | Ht 63.0 in | Wt 233.0 lb

## 2015-07-04 DIAGNOSIS — M797 Fibromyalgia: Secondary | ICD-10-CM

## 2015-07-04 DIAGNOSIS — Z23 Encounter for immunization: Secondary | ICD-10-CM | POA: Diagnosis not present

## 2015-07-04 MED ORDER — GABAPENTIN 100 MG PO CAPS: ORAL_CAPSULE | ORAL | Status: DC

## 2015-07-04 NOTE — Patient Instructions (Addendum)
Please try 1/2 tablet of the flexeril at bedtime for sleep- try the increased dose of gabapentin first.   Increase your gabapentin to two tablets at bedtime

## 2015-07-04 NOTE — Progress Notes (Signed)
   Subjective:    Patient ID: Lindsay Norman, female    DOB: [birth year], 40 y.o.   MRN: [id number]  HPI This is a pleasant 40 yo female who presents for follow up of fibromyalgia, depression. She has had small amount improvement in pain and sleep on neurontin 100 mg BID, but continues to have pain daily that is affecting her sleep and ability to care for her children. She is tired of hurting. She is currently on mandatory 11 hour work days 5-6 days a week. 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 She is a single mom to two girls and gets no help or support from her children's father. She has previously had periods of work restriction to 8 hours a week which helped her obtains some pain relief and balance. She is requesting a month of restriction to 8 hours a week.   She smokes and is trying to decrease cigarette use.   Past Medical History  Diagnosis Date  . Polycystic ovarian syndrome    Past Surgical History  Procedure Laterality Date  . Cholecystectomy    . Knee surgery     Family History  Problem Relation Age of Onset  . Fibromyalgia Mother   . Hypertension Mother   . Depression Mother   . Cancer Father     colon cancer  . Osteoarthritis Father   . Depression Brother    Social History  Substance Use Topics  . Smoking status: Current Every Day Smoker -- 1.00 packs/day    Types: Cigarettes  . Smokeless tobacco: Not on file  . Alcohol Use: No    Review of Systems  Respiratory: Negative for cough and shortness of breath.   Cardiovascular: Negative for chest pain, palpitations and leg swelling.  Musculoskeletal: Positive for myalgias, back pain and neck pain.       Objective:   Physical Exam  Constitutional: She appears well-developed and well-nourished. No distress.  Obese   HENT:  Head: Normocephalic and atraumatic.  Eyes: Conjunctivae are normal.  Cardiovascular: Normal rate.   Pulmonary/Chest: Effort normal.  Neurological: She is alert.    Skin: Skin is warm and dry. She is not diaphoretic.  Psychiatric: She has a normal mood and affect. Her behavior is normal. Judgment and thought content normal.  Tearful at times  Vitals reviewed.     BP 138/86 mmHg  Pulse 84  Temp(Src) 97.9 F (36.6 C)  Resp 16  Wt 233 lb (105.688 kg)  LMP 05/28/2015  Wt Readings from Last 3 Encounters:  07/04/15 233 lb (105.688 kg)  06/04/15 230 lb (104.327 kg)  09/07/14 198 lb (89.812 kg)       Assessment & Plan:  1. Need for Tdap vaccination - Tdap vaccine greater than or equal to 7yo IM  2. Fibromyalgia - will increase gabapentin as tolerated, monitor results and consider adding duloxetine if unable to obtain symptom management.  - gabapentin (NEURONTIN) 100 MG capsule; Take one tablet in the morning and two at night.  Dispense: 90 capsule; Refill: 2 - Wrote her a work restriction letter for max 8 hour work days for 1 month - follow up in 1 month, sooner if worsening symptoms   Olean Ree, FNP-BC  [facility], [facility]  07/09/2015 8:14 AM

## 2015-07-09 ENCOUNTER — Encounter: Payer: Self-pay | Admitting: Family Medicine

## 2015-07-30 ENCOUNTER — Ambulatory Visit (INDEPENDENT_AMBULATORY_CARE_PROVIDER_SITE_OTHER): Payer: BLUE CROSS/BLUE SHIELD | Admitting: Family Medicine

## 2015-07-30 ENCOUNTER — Encounter: Payer: Self-pay | Admitting: Family Medicine

## 2015-07-30 VITALS — BP 130/86 | HR 80 | Temp 98.6°F | Resp 16 | Ht 63.0 in | Wt 238.2 lb

## 2015-07-30 DIAGNOSIS — Z720 Tobacco use: Secondary | ICD-10-CM | POA: Diagnosis not present

## 2015-07-30 DIAGNOSIS — M797 Fibromyalgia: Secondary | ICD-10-CM | POA: Diagnosis not present

## 2015-07-30 MED ORDER — DULOXETINE HCL 30 MG PO CPEP: ORAL_CAPSULE | ORAL | Status: DC

## 2015-07-30 MED ORDER — GABAPENTIN 300 MG PO CAPS: ORAL_CAPSULE | ORAL | Status: DC

## 2015-07-30 NOTE — Patient Instructions (Signed)
Please start your duloxetine first and after 2 weeks, increase gabapentin dose

## 2015-07-30 NOTE — Progress Notes (Signed)
Subjective:    Patient ID: Lindsay Norman, female    DOB: 06/28/1975, 40 y.o.   MRN: [id number]  HPI This is a pleasant 40 year old that his come in today for a follow-up of her pain medication for bilateral hip and leg pain. Pt states that pain medication is not working at this time. Reports that she still smokes a pack within 4 days. Wants to explore the possibility of OI.   Past Medical History  Diagnosis Date  . Polycystic ovarian syndrome    Family History  Problem Relation Age of Onset  . Fibromyalgia Mother   . Hypertension Mother   . Depression Mother   . Cancer Father     colon cancer  . Osteoarthritis Father   . Depression Brother    Social History   Social History  . Marital Status: Divorced    Spouse Name: N/A  . Number of Children: N/A  . Years of Education: N/A   Occupational History  . Not on file.   Social History Main Topics  . Smoking status: Current Every Day Smoker -- 1.00 packs/day    Types: Cigarettes  . Smokeless tobacco: Not on file  . Alcohol Use: No  . Drug Use: No  . Sexual Activity: Yes   Other Topics Concern  . 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  Musculoskeletal: Positive for back pain (lower back, along with pain in the hip) and arthralgias (bilateral hip).       Objective:   Physical Exam  Constitutional: She is oriented to person, place, and time. She appears well-developed and well-nourished.  HENT:  Head: Normocephalic and atraumatic.  Neurological: She is alert and oriented to person, place, and time.  Psychiatric: Her behavior is normal. Judgment and thought content normal.      BP 130/86 mmHg  Pulse 80  Temp(Src) 98.6 F (37 C) (Oral)  Resp 16  Ht 5\' 3"  (1.6 m)  Wt 238 lb 3.2 oz (108.047 kg)  BMI 42.21 kg/m2  SpO2 98%  LMP 07/23/2015 Wt Readings from  Last 3 Encounters:  07/30/15 238 lb 3.2 oz (108.047 kg)  07/04/15 233 lb (105.688 kg)  06/04/15 230 lb (104.327 kg)   Temp Readings from Last 3 Encounters:  07/30/15 98.6 F (37 C) Oral  07/04/15 97.9 F (36.6 C)   06/04/15 98 F (36.7 C)    BP Readings from Last 3 Encounters:  07/30/15 130/86  07/04/15 138/86  06/04/15 134/84   Pulse Readings from Last 3 Encounters:  07/30/15 80  07/04/15 84  06/04/15 72       Assessment & Plan:  1. Fibromyalgia - DULoxetine (CYMBALTA) 30 MG capsule; Take 1 tablet daily for 7 days, then increase to 2 tablets daily  Dispense: 60 capsule; Refill: 3 - gabapentin (NEURONTIN) 300 MG capsule; Take 1/2 tablet in am, 1 tablet pm for 7 days then increase to 1 tablet twice a day  Dispense: 60 capsule; Refill: 3 - Ambulatory referral to Rheumatology  2. Morbid obesity due to excess calories (HCC) -lean meats -more vegetables -less sweets and fatty foods  3. Tobacco abuse - encouraged to stop smoking  RTC precautions reviewed  Marlon Pelora Ashlen Kiger, FNP-student  [facility], [facility]  07/30/2015 5:06 PM     07/30/2015 4:59 PM

## 2015-07-30 NOTE — Progress Notes (Signed)
Subjective:    Patient ID: Lindsay Norman, female    DOB: 1975-09-29, 40 y.o.   MRN: [id number]  HPI This is a pleasant 40 yo female who presents today for follow up of fibromyalgia. She was seen last month and her gabapentin was increased. This helped a little. She continues to have severe pain that is daily. Her work was reduced to a maximum 8 hours a day last month which has helped with her lifestyle and some with energy level. She continues to feel incredibly stressed and short tempered. She is unhappy with her work environment and is contemplating going back to her previous department but this would mean a decrease in pay.   She would like to be worked up for OI (osteogenesis imperfecta). Her sister and daughter have this and she thinks she might have it as well. She understands there is no treatment, but she wonders if her constellation of symptoms is from OI. She has been seen by Dr. Kellie Simmering in the past.   Has been decreasing cigarette use. Currently smoking 1/3-1/4 ppd, down from 1 ppd.   Review of Systems  Constitutional: Positive for fatigue.  HENT: Positive for congestion, rhinorrhea and sinus pressure.   Musculoskeletal: Positive for myalgias and arthralgias.  Psychiatric/Behavioral: Positive for dysphoric mood.       Objective:   Physical Exam Physical Exam  Vitals reviewed. Constitutional: Oriented to person, place, and time. Appears well-developed and well-nourished.  HENT:  Head: Normocephalic and atraumatic.  Eyes: Conjunctivae are normal.  Neck: Normal range of motion. Neck supple.  Cardiovascular: Normal rate.   Pulmonary/Chest: Effort normal.  Musculoskeletal: Normal range of motion.  Neurological: Alert and oriented to person, place, and time.  Skin: Skin is warm and dry.  Psychiatric: Normal mood and affect. Behavior is normal. Judgment and thought content normal.      BP 130/86 mmHg  Pulse 80  Temp(Src) 98.6 F (37 C) (Oral)  Resp 16  Ht   (1.6 m)  Wt 238 lb 3.2 oz (108.047 kg)  BMI 42.21 kg/m2  SpO2 98%  LMP 07/23/2015 Wt Readings from Last 3 Encounters:  07/30/15 238 lb 3.2 oz (108.047 kg)  07/04/15 233 lb (105.688 kg)  06/04/15 230 lb (104.327 kg)   Depression screen [facility] 2/9 07/30/2015 07/30/2015 07/04/2015 06/04/2015  Decreased Interest 0 0 0 0  Down, Depressed, Hopeless 0 0 3 3  PHQ - 2 Score 0 0 3 3  Altered sleeping 0 0 1 1  Tired, decreased energy 0 0 3 3  Change in appetite 0 0 3 3  Feeling bad or failure about yourself  0 0 3 3  Trouble concentrating 0 0 1 1  Moving slowly or fidgety/restless 0 0 1 1  Suicidal thoughts 0 0 0 0  PHQ-9 Score 0 0 15 15  Difficult doing work/chores - - - Somewhat difficult         Assessment & Plan:  1. Fibromyalgia - start duloxetine and after 2 weeks, increase gabapentin  - DULoxetine (CYMBALTA) 30 MG capsule; Take 1 tablet daily for 7 days, then increase to 2 tablets daily  Dispense: 60 capsule; Refill: 3 - gabapentin (NEURONTIN) 300 MG capsule; Take 1/2 tablet in am, 1 tablet pm for 7 days then increase to 1 tablet twice a day  Dispense: 60 capsule; Refill: 3 - Ambulatory referral to Rheumatology - Maximum 8 hour work days until follow up  2. Morbid obesity due to excess calories (HCC) - encouraged  healthy food choices, eliminating soda and snack foods. - discussed importance of small, frequent meals, quality sleep - encouraged walking for 15-20 minutes 4x/week  3. Tobacco abuse - encouraged her to pick stop smoking date  - follow up in 2 months  Olean Reeeborah Gessner, FNP-BC  [facility], [facility]  08/04/2015 10:09 AM

## 2015-08-05 ENCOUNTER — Other Ambulatory Visit: Payer: Self-pay

## 2015-08-05 DIAGNOSIS — M797 Fibromyalgia: Secondary | ICD-10-CM

## 2015-08-05 NOTE — Telephone Encounter (Signed)
Pharm faxed notice that gabapentin 300 mg only comes as capsules so it can not be halved for the first 7 days as instr'd on sig. Lindsay Norman, can you change the dosing somehow? If it comes in 150 mg caps, you may need to write a separate Rx for the first 7 days of that strength and then have her switch to 300 mg caps.

## 2015-08-06 MED ORDER — GABAPENTIN 100 MG PO CAPS: ORAL_CAPSULE | ORAL | Status: DC

## 2015-08-06 NOTE — Telephone Encounter (Signed)
Spoke to pt to see what she has been taking and she reported that the pharm took back the 300 mg Rx because pt decided she would rather stay with the 100 mg at prev dose for now since she started the Cymbalta to make just one change at a time. She would like to come back to see Eunice BlaseDebbie in two months and then discuss whether an increase in gabapentin should be Rxd after seeing what difference the Cymbalta makes. I had cancelled remaining RFs of the 100mg  when I spoke to pharm, so will pend a couple more mos RFs. Debbie if this plan is OK with you, please sign RFs and we do not need to call pt back. If you want pt to do something else, please advise and I will call her back. BTW, the 100 mg do not come in tablets either (tabs only available in 600 and 800mg ), so she could not take 1 1/2 of the 100 mg either.

## 2015-08-06 NOTE — Telephone Encounter (Signed)
Called pharm and they reported that they dispensed Rx to pt as written on 07/30/15. They were not waiting for answer from provider.

## 2015-08-06 NOTE — Telephone Encounter (Signed)
She should still have some 100 mg tablets, she can take 1 1/2 of those for the first week.

## 2015-08-06 NOTE — Addendum Note (Signed)
Addended by: Sheppard PlumberBRIGGS, Jessi Pitstick A on: 08/06/2015 04:16 PM   Modules accepted: Orders

## 2015-09-09 ENCOUNTER — Telehealth: Payer: Self-pay

## 2015-09-09 NOTE — Telephone Encounter (Signed)
Pt states [name] had put her on NEURONTIN 100 MG and CYMBALTA 30 MG but something have started messing with her mood again and she didn't know if she needed to stop one or up the dosage On one. Please call [phone]

## 2015-09-11 ENCOUNTER — Other Ambulatory Visit: Payer: Self-pay | Admitting: Family Medicine

## 2015-09-11 DIAGNOSIS — M797 Fibromyalgia: Secondary | ICD-10-CM

## 2015-09-11 MED ORDER — GABAPENTIN 100 MG PO CAPS: 100.0000 mg | ORAL_CAPSULE | ORAL | Status: DC

## 2015-09-11 NOTE — Telephone Encounter (Signed)
Called and spoke to patient who reports that duloxetine is working well for her but increased dose of am neurotin is making her more moody. We will try gabapentin 100 mg in the morning and I sent this in. She is sleeping well and will continue gabapentin 300 mg at bedtime. I re-ordered rheumatology referral because it looks like I accidentally put in neurology referral. Patient has an appointment next week.

## 2015-09-17 ENCOUNTER — Encounter: Payer: Self-pay | Admitting: Family Medicine

## 2015-09-17 ENCOUNTER — Ambulatory Visit (INDEPENDENT_AMBULATORY_CARE_PROVIDER_SITE_OTHER): Payer: BLUE CROSS/BLUE SHIELD | Admitting: Family Medicine

## 2015-09-17 VITALS — BP 123/84 | HR 81 | Temp 98.8°F | Resp 16 | Ht 63.0 in | Wt 233.6 lb

## 2015-09-17 DIAGNOSIS — Z720 Tobacco use: Secondary | ICD-10-CM

## 2015-09-17 DIAGNOSIS — M797 Fibromyalgia: Secondary | ICD-10-CM | POA: Diagnosis not present

## 2015-09-17 NOTE — Patient Instructions (Signed)
  Please call in two months and make an appointment for August  IF you received an x-ray today, you will receive an invoice from [facility]. Please contact [facility] at [phone] with questions or concerns regarding your invoice.   IF you received labwork today, you will receive an invoice from [facility]. Please contact [facility] at [phone] with questions or concerns regarding your invoice.   Our billing staff will not be able to assist you with questions regarding bills from these companies.  You will be contacted with the lab results as soon as they are available. The fastest way to get your results is to activate your My Chart account. Instructions are located on the last page of this paperwork. If you have not heard from us regarding the results in 2 weeks, please contact this office.

## 2015-09-17 NOTE — Progress Notes (Signed)
Subjective:    Patient ID: Lindsay Norman, female    DOB: 02/26/1976, 40 y.o.   MRN: [id number]  HPI This is a pleasant 40 yo female who presents today for follow up of fibromyalgia. She has been working 8 hour days and has increased her activity level. She has lost 5 pounds. I spoke with her last week about her meds and made an adjustment in her Gabapentin. Sleep is intermittent. She is preparing to break up with her boyfriend who has lived with her for 2 years. She has put in for a different job with shorter hours and is waiting to hear. She feels she is making some positive changes. Mood is definitely improved on duloxetine. She feels more patient. She is still waiting for rheumatology appointment to discuss her fibro, avascular necrosis of hip and family history of OI.   Past Medical History  Diagnosis Date  . Polycystic ovarian syndrome    Past Surgical History  Procedure Laterality Date  . Cholecystectomy    . Knee surgery     Family History  Problem Relation Age of Onset  . Fibromyalgia Mother   . Hypertension Mother   . Depression Mother   . Cancer Father     colon cancer  . Osteoarthritis Father   . Depression Brother    Social History  Substance Use Topics  . Smoking status: Current Every Day Smoker -- 1.00 packs/day    Types: Cigarettes  . Smokeless tobacco: None  . Alcohol Use: No    Review of Systems Per HPI    Objective:   Physical Exam Physical Exam  Vitals reviewed. Constitutional: Oriented to person, place, and time. Appears well-developed and well-nourished. Obese. HENT:  Head: Normocephalic and atraumatic.  Eyes: Conjunctivae are normal.  Neck: Normal range of motion. Neck supple.  Cardiovascular: Normal rate.   Pulmonary/Chest: Effort normal. .  Neurological: Alert and oriented to person, place, and time.  Skin: Skin is warm and dry.  Psychiatric: Normal mood and affect. Behavior is normal. Judgment and thought content normal.   BP 123/84  mmHg  Pulse 81  Temp(Src) 98.8 F (37.1 C) (Oral)  Resp 16  Ht 5\' 3"  (1.6 m)  Wt 233 lb 9.6 oz (105.96 kg)  BMI 41.39 kg/m2  SpO2 97%  LMP 09/02/2015 Wt Readings from Last 3 Encounters:  09/17/15 233 lb 9.6 oz (105.96 kg)  07/30/15 238 lb 3.2 oz (108.047 kg)  07/04/15 233 lb (105.688 kg)   Depression screen [facility] 2/9 09/17/2015 07/30/2015 07/30/2015 07/04/2015 06/04/2015  Decreased Interest 0 0 0 0 0  Down, Depressed, Hopeless 0 0 0 3 3  PHQ - 2 Score 0 0 0 3 3  Altered sleeping - 0 0 1 1  Tired, decreased energy - 0 0 3 3  Change in appetite - 0 0 3 3  Feeling bad or failure about yourself  - 0 0 3 3  Trouble concentrating - 0 0 1 1  Moving slowly or fidgety/restless - 0 0 1 1  Suicidal thoughts - 0 0 0 0  PHQ-9 Score - 0 0 15 15  Difficult doing work/chores - - - - Somewhat difficult         Assessment & Plan:  1. Fibromyalgia - awaiting rheumatology appointment - encouraged continued weight loss, increased activity - gabapentin (NEURONTIN) 300 MG capsule; Take 300 mg by mouth at bedtime. She will continue 100 mg in the morning.   2. Morbid obesity due to excess calories (  HCC) -  Reviewed basic dietary principals- eliminate soda/juice/sweet tea, decrease simple carbs, increase vegetables, proteins, fruits and increase activity   3. Tobacco abuse - she is working to decrease cigarettes. Her boyfriend also smokes and she anticipated being able to decrease more when they are no longer together  - follow up 3 months Olean Ree, FNP-BC  [facility], [facility]  09/23/2015 9:26 PM -

## 2015-09-23 DIAGNOSIS — M797 Fibromyalgia: Secondary | ICD-10-CM | POA: Insufficient documentation

## 2015-09-23 DIAGNOSIS — Z720 Tobacco use: Secondary | ICD-10-CM | POA: Insufficient documentation

## 2015-09-26 ENCOUNTER — Ambulatory Visit (INDEPENDENT_AMBULATORY_CARE_PROVIDER_SITE_OTHER): Payer: BLUE CROSS/BLUE SHIELD | Admitting: Family Medicine

## 2015-09-26 VITALS — BP 126/80 | HR 97 | Temp 98.5°F | Resp 18 | Ht 63.0 in | Wt 233.0 lb

## 2015-09-26 DIAGNOSIS — M797 Fibromyalgia: Secondary | ICD-10-CM | POA: Diagnosis not present

## 2015-09-26 DIAGNOSIS — R5382 Chronic fatigue, unspecified: Secondary | ICD-10-CM

## 2015-09-26 MED ORDER — DULOXETINE HCL 30 MG PO CPEP: ORAL_CAPSULE | ORAL | Status: DC

## 2015-09-26 MED ORDER — DULOXETINE HCL 60 MG PO CPEP: 60.0000 mg | ORAL_CAPSULE | Freq: Every day | ORAL | Status: DC

## 2015-09-26 MED ORDER — GABAPENTIN 100 MG PO CAPS: ORAL_CAPSULE | ORAL | Status: DC

## 2015-09-26 NOTE — Patient Instructions (Addendum)
I have sent in a new prescription to your pharmacy for the 60 mg of Cymbalta- you will only take one a day.   Let's try to go back to gabapentin 100 mg in the morning and 200 mg at night  Eat regular small meals including protein with each meal/snack  Maintain a regular, consistent bedtime.    You have [facility] on 10-01-15 @ 8:30 am with Tyler DeisWheeler             IF you received an x-ray today, you will receive an invoice from [facility]. Please contact [facility] at [phone] with questions or concerns regarding your invoice.   IF you received labwork today, you will receive an invoice from [facility]. Please contact [facility] at [phone] with questions or concerns regarding your invoice.   Our billing staff will not be able to assist you with questions regarding bills from these companies.  You will be contacted with the lab results as soon as they are available. The fastest way to get your results is to activate your My Chart account. Instructions are located on the last page of this paperwork. If you have not heard from us regarding the results in 2 weeks, please contact this office.

## 2015-09-26 NOTE — Progress Notes (Signed)
   Subjective:    Patient ID: Lindsay Norman, female    DOB: 06/21/1975, 40 y.o.   MRN: [id number]  HPI This is a pleasant female who presents today with worsening fatigue over last 6-7 days. She has been exceptionally tired. Had to call out of work once this week- unusual for her. Feels more on edge while working. Overall sleeping ok. Doesn't feel rested. Has not broken up with her boyfriend but is planning on breaking up with him once his father recovers from open heart surgery. She denies additional stress or anxiety, states level is stable.   She feels that her body feels over all better. She is having less pain.   Past Medical History  Diagnosis Date  . Polycystic ovarian syndrome    Past Surgical History  Procedure Laterality Date  . Cholecystectomy    . Knee surgery     Family History  Problem Relation Age of Onset  . Fibromyalgia Mother   . Hypertension Mother   . Depression Mother   . Cancer Father     colon cancer  . Osteoarthritis Father   . Depression Brother    Social History  Substance Use Topics  . Smoking status: Current Every Day Smoker -- 1.00 packs/day    Types: Cigarettes  . Smokeless tobacco: None  . Alcohol Use: No      Review of Systems Per HPI    Objective:   Physical Exam  Constitutional: She is oriented to person, place, and time. She appears well-developed and well-nourished. No distress.  HENT:  Head: Normocephalic and atraumatic.  Eyes: Conjunctivae are normal.  Neck: Normal range of motion. Neck supple.  Cardiovascular: Normal rate and regular rhythm.   Pulmonary/Chest: Effort normal.  Neurological: She is alert and oriented to person, place, and time.  Skin: She is not diaphoretic.  Psychiatric: She has a normal mood and affect. Her behavior is normal. Judgment and thought content normal.  Affect fairly bright today, she is not tearful.   Vitals reviewed.     BP 126/80 mmHg  Pulse 97  Temp(Src) 98.5 F (36.9 C) (Oral)   Resp 18  Ht 5\' 3"  (1.6 m)  Wt 233 lb (105.688 kg)  BMI 41.28 kg/m2  SpO2 99%  LMP 09/02/2015 Wt Readings from Last 3 Encounters:  09/26/15 233 lb (105.688 kg)  09/17/15 233 lb 9.6 oz (105.96 kg)  07/30/15 238 lb 3.2 oz (108.047 kg)       Assessment & Plan:  1. Fibromyalgia - DULoxetine (CYMBALTA) 60 MG capsule; Take 1 capsule (60 mg total) by mouth daily.  Dispense: 90 capsule; Refill: 1 - gabapentin (NEURONTIN) 100 MG capsule; Take 1 tablet in the morning and 2 tablets at bedtime.  Dispense: 180 capsule; Refill: 0 - She has an appointment with rheumatology in next two weeks.  2. Chronic fatigue - she wonders if this is worse with gabapentin 300 mg qhs dose, will go back to 200 mg at bedtime - encouraged her to maintain regular sleep schedule, try to add some exercise at least every other day.  - follow up as schueduled   Olean Reeeborah Sheela Mcculley, FNP-BC  [facility], [facility]  09/29/2015 9:21 PM

## 2015-10-18 ENCOUNTER — Telehealth: Payer: Self-pay | Admitting: Emergency Medicine

## 2015-10-18 ENCOUNTER — Telehealth: Payer: Self-pay

## 2015-10-18 NOTE — Telephone Encounter (Signed)
Pt continues to complain of worsening fatigue with taking Gabapentin 300 mg tab QHS. States, lowered dose is affecting her mood as well. Please f/u

## 2015-10-18 NOTE — Telephone Encounter (Signed)
Called pt. No answer °

## 2015-10-18 NOTE — Telephone Encounter (Signed)
Please call her and tell her she should stop the gabapentin if it is affecting her moods and energy level. Did she have appointment with Dr. Tyler DeisWheeler on 5/16?

## 2015-10-18 NOTE — Telephone Encounter (Signed)
Pt is needing to talk to someone about the dosage on her gabapenton she states she feels tired a lot   Best number [phone]

## 2015-10-22 NOTE — Telephone Encounter (Signed)
Left message for pt to call back  °

## 2015-11-27 ENCOUNTER — Other Ambulatory Visit: Payer: Self-pay | Admitting: Family Medicine

## 2015-12-08 ENCOUNTER — Emergency Department (HOSPITAL_BASED_OUTPATIENT_CLINIC_OR_DEPARTMENT_OTHER)
Admission: EM | Admit: 2015-12-08 | Discharge: 2015-12-08 | Disposition: A | Discharge status: Home / Self Care | Payer: BLUE CROSS/BLUE SHIELD | Attending: Emergency Medicine | Admitting: Emergency Medicine

## 2015-12-08 ENCOUNTER — Emergency Department (HOSPITAL_BASED_OUTPATIENT_CLINIC_OR_DEPARTMENT_OTHER): Payer: BLUE CROSS/BLUE SHIELD

## 2015-12-08 ENCOUNTER — Encounter (HOSPITAL_BASED_OUTPATIENT_CLINIC_OR_DEPARTMENT_OTHER): Payer: Self-pay | Admitting: *Deleted

## 2015-12-08 DIAGNOSIS — M546 Pain in thoracic spine: Secondary | ICD-10-CM | POA: Diagnosis not present

## 2015-12-08 DIAGNOSIS — M25551 Pain in right hip: Secondary | ICD-10-CM | POA: Diagnosis not present

## 2015-12-08 DIAGNOSIS — M549 Dorsalgia, unspecified: Secondary | ICD-10-CM

## 2015-12-08 DIAGNOSIS — F17210 Nicotine dependence, cigarettes, uncomplicated: Secondary | ICD-10-CM | POA: Diagnosis not present

## 2015-12-08 DIAGNOSIS — W19XXXA Unspecified fall, initial encounter: Secondary | ICD-10-CM

## 2015-12-08 DIAGNOSIS — Y999 Unspecified external cause status: Secondary | ICD-10-CM | POA: Insufficient documentation

## 2015-12-08 DIAGNOSIS — Y939 Activity, unspecified: Secondary | ICD-10-CM | POA: Diagnosis not present

## 2015-12-08 DIAGNOSIS — Y92039 Unspecified place in apartment as the place of occurrence of the external cause: Secondary | ICD-10-CM | POA: Diagnosis not present

## 2015-12-08 DIAGNOSIS — M25559 Pain in unspecified hip: Secondary | ICD-10-CM

## 2015-12-08 DIAGNOSIS — R079 Chest pain, unspecified: Secondary | ICD-10-CM | POA: Diagnosis not present

## 2015-12-08 HISTORY — DX: Osteogenesis imperfecta: Q78.0

## 2015-12-08 HISTORY — DX: Other specified disorders of bone density and structure, unspecified site: M85.80

## 2015-12-08 HISTORY — DX: Scoliosis, unspecified: M41.9

## 2015-12-08 HISTORY — DX: Fibromyalgia: M79.7

## 2015-12-08 MED ORDER — CYCLOBENZAPRINE HCL 10 MG PO TABS: 10.0000 mg | ORAL_TABLET | Freq: Two times a day (BID) | ORAL | 0 refills | Status: DC | PRN

## 2015-12-08 MED ORDER — IBUPROFEN 400 MG PO TABS
600.0000 mg | ORAL_TABLET | Freq: Once | ORAL | Status: AC
  Administered 2015-12-08: 600 mg via ORAL
  Filled 2015-12-08: qty 1

## 2015-12-08 MED ORDER — NAPROXEN 500 MG PO TABS: 500.0000 mg | ORAL_TABLET | Freq: Two times a day (BID) | ORAL | 0 refills | Status: DC

## 2015-12-08 NOTE — Discharge Instructions (Signed)
Take your medications as prescribed as needed for pain relief. I also recommend applying ice to affected areas for 15 minutes 3-4 times daily as needed for pain. Please follow up with a primary care provider from the Resource Guide provided below in 1 week if your symptoms persist. Please return to the Emergency Department if symptoms worsen or new onset of fever, numbness, tingling, saddle anesthesia, loss of bowel or bladder, weakness, abdominal pain, vomiting, urinary retention.
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
## 2015-12-25 ENCOUNTER — Telehealth: Payer: Self-pay

## 2015-12-25 NOTE — Telephone Encounter (Signed)
Pt is unable to schedule til next week and is needing a refill on her cymbalta at least for a week

## 2015-12-27 NOTE — Telephone Encounter (Signed)
DULoxetine (CYMBALTA) 60 MG capsule [[id number]][[id number]]  Order Details  Dose: 60 mg Route: Oral Frequency: Daily  Indications of Use: Fibromyalgia Syndrome  Dispense Quantity:  90 capsule Refills:  1 Fills remaining:  --        Sig: Take 1 capsule (60 mg total) by mouth daily.       Written Date:  09/26/15 Expiration Date:  09/25/16    Start Date:  09/26/15 End Date:  --            Left message for pt to call back.

## 2015-12-31 NOTE — Telephone Encounter (Signed)
Pt never called back. LMOM for pt explaining that she should have a 90 day RF at the pharm already for this that was sent in on May 11. Asked for CB if she needs anything further.

## 2016-01-07 ENCOUNTER — Telehealth: Payer: Self-pay

## 2016-01-07 DIAGNOSIS — M797 Fibromyalgia: Secondary | ICD-10-CM

## 2016-01-07 NOTE — Telephone Encounter (Signed)
Pharmacy is calling because patient needs a refill for gabapentin.   [facility] [place]

## 2016-01-09 NOTE — Telephone Encounter (Signed)
Called and discussed w/pharm because pt's dose had been decreased at May OV d/t increased fatigue on higher dose. At that time pt was to be seen by rheumatologist, Dr Tyler DeisWheeler in a couple of weeks. After this, pt had called concerning dose and Debbie had wanted us to check w/pt to see if she had been in to see Dr Tyler DeisWheeler yet, but pt never returned our call. Pharm will check w/[facility] to see if pt has been in and see if Dr Tyler DeisWheeler can take over management of this med. Pharm will let me know if anything else is needed.

## 2016-01-15 NOTE — Telephone Encounter (Signed)
[facility] [place] left v/m request refill gabapentin. Last refilled # 180 on 09/26/15 by Olean Reeeborah Gessner FNP. Last seen 09/26/15.Please advise.

## 2016-01-15 NOTE — Addendum Note (Signed)
Addended by: Patience MuscaISLEY, RENA M on: 01/15/2016 12:38 PM   Modules accepted: Orders

## 2016-01-17 NOTE — Telephone Encounter (Signed)
Called pharmacist again and they reported that there were 5 different Rxs for gabapentin that were active in their system. He could not find any Rxs for anything from Dr Tyler DeisWheeler. I explained that the messages since last OV have all been about gabapentin even at lower dose from 5/11 Rx was causing fatigue and the last message from D Leone PayorGessner was to stop gabapentin if causing fatigue and she wanted to know if pt has seen Dr Tyler DeisWheeler. I denied all RFs of gabapentin and he inactivated all of them in his system. He will let pt know that she will need to see Dr Tyler DeisWheeler to manage ideally, or must come in to be seen to discuss therapy.

## 2016-03-10 ENCOUNTER — Ambulatory Visit (INDEPENDENT_AMBULATORY_CARE_PROVIDER_SITE_OTHER): Payer: BLUE CROSS/BLUE SHIELD

## 2016-03-10 ENCOUNTER — Ambulatory Visit (INDEPENDENT_AMBULATORY_CARE_PROVIDER_SITE_OTHER): Payer: BLUE CROSS/BLUE SHIELD | Admitting: Physician Assistant

## 2016-03-10 VITALS — BP 120/86 | HR 114 | Temp 101.2°F | Resp 17 | Ht 64.0 in | Wt 245.0 lb

## 2016-03-10 DIAGNOSIS — Z760 Encounter for issue of repeat prescription: Secondary | ICD-10-CM | POA: Diagnosis not present

## 2016-03-10 DIAGNOSIS — R509 Fever, unspecified: Secondary | ICD-10-CM

## 2016-03-10 DIAGNOSIS — R062 Wheezing: Secondary | ICD-10-CM | POA: Diagnosis not present

## 2016-03-10 LAB — POCT CBC
GRANULOCYTE PERCENT: 85.6 % — AB (ref 37–80)
HCT, POC: 35.5 % — AB (ref 37.7–47.9)
HEMOGLOBIN: 12.6 g/dL (ref 12.2–16.2)
Lymph, poc: 1.4 (ref 0.6–3.4)
MCH: 31.3 pg — AB (ref 27–31.2)
MCHC: 35.5 g/dL — AB (ref 31.8–35.4)
MCV: 88.2 fL (ref 80–97)
MID (CBC): 0.7 (ref 0–0.9)
MPV: 7 fL (ref 0–99.8)
PLATELET COUNT, POC: 220 10*3/uL (ref 142–424)
POC Granulocyte: 12.5 — AB (ref 2–6.9)
POC LYMPH PERCENT: 9.9 %L — AB (ref 10–50)
POC MID %: 4.5 %M (ref 0–12)
RBC: 4.02 M/uL — AB (ref 4.04–5.48)
RDW, POC: 12.4 %
WBC: 14.6 10*3/uL — AB (ref 4.6–10.2)

## 2016-03-10 LAB — POCT INFLUENZA A/B
INFLUENZA A, POC: NEGATIVE
Influenza B, POC: NEGATIVE

## 2016-03-10 LAB — POCT URINE PREGNANCY: PREG TEST UR: NEGATIVE

## 2016-03-10 LAB — POCT RAPID STREP A (OFFICE): Rapid Strep A Screen: NEGATIVE

## 2016-03-10 MED ORDER — AZITHROMYCIN 250 MG PO TABS: ORAL_TABLET | ORAL | 0 refills | Status: DC

## 2016-03-10 MED ORDER — CEFTRIAXONE SODIUM 250 MG IJ SOLR
1000.0000 mg | Freq: Once | INTRAMUSCULAR | Status: AC
  Administered 2016-03-10: 1000 mg via INTRAMUSCULAR

## 2016-03-10 MED ORDER — IPRATROPIUM BROMIDE 0.02 % IN SOLN
0.5000 mg | Freq: Once | RESPIRATORY_TRACT | Status: AC
  Administered 2016-03-10: 0.5 mg via RESPIRATORY_TRACT

## 2016-03-10 MED ORDER — DULOXETINE HCL 60 MG PO CPEP: 60.0000 mg | ORAL_CAPSULE | Freq: Every day | ORAL | 1 refills | Status: DC

## 2016-03-10 MED ORDER — GABAPENTIN 100 MG PO CAPS: ORAL_CAPSULE | ORAL | 1 refills | Status: DC

## 2016-03-10 MED ORDER — ALBUTEROL SULFATE (2.5 MG/3ML) 0.083% IN NEBU
2.5000 mg | INHALATION_SOLUTION | RESPIRATORY_TRACT | Status: AC
  Administered 2016-03-10: 2.5 mg via RESPIRATORY_TRACT

## 2016-03-10 NOTE — Patient Instructions (Signed)
     IF you received an x-ray today, you will receive an invoice from [facility]. Please contact [facility] at [phone] with questions or concerns regarding your invoice.   IF you received labwork today, you will receive an invoice from [facility]. Please contact [facility] at [phone] with questions or concerns regarding your invoice.   Our billing staff will not be able to assist you with questions regarding bills from these companies.  You will be contacted with the lab results as soon as they are available. The fastest way to get your results is to activate your My Chart account. Instructions are located on the last page of this paperwork. If you have not heard from us regarding the results in 2 weeks, please contact this office.      

## 2016-03-10 NOTE — Progress Notes (Signed)
By signing my name below, I, Stann Oresung-Kai Tsai, attest that this documentation has been prepared under the direction and in the presence of Deliah BostonMichael Clark, PA-C. Electronically Signed: Stann Oresung-Kai Tsai, Scribe. 03/10/2016 , 10:00 AM .  Patient was seen in Room 3 .  03/10/2016 11:31 AM   DOB: 09/08/1975 / MRN: [id number]  SUBJECTIVE:  Lindsay Norman is a 40 y.o. female presenting for sore throat and ear pain with dizziness. Patient states having sore throat with fever and chills, as well as body aches all over. She notes feeling hot and cold spells while sitting in the waiting room. She usually has sinuses acting up and takes medication; however, she reports this feels different from her usual sinusitis. She mentions that her worst symptom is her throat pain when she's drinking water. She denies any known sick contact at work. She was planning to receive flu shot at work later this week. She denies leg swelling or calf pain. She denies urinary symptoms.   She also requests medication refill of her cymbalta and gabapentin. She was previously followed by Deboraha Sprangebbie Gessner, NP. She is a current smoker, 1 ppd per [facility].   She is allergic to celebrex [celecoxib] and penicillins.   She  has a past medical history of Fibromyalgia; Osteogenesis imperfecta; Osteopenia; Polycystic ovarian syndrome; and Scoliosis.    She  reports that she has been smoking Cigarettes.  She has been smoking about 1.00 pack per day. She has never used smokeless tobacco. She reports that she does not drink alcohol or use drugs. She  reports that she currently engages in sexual activity. The patient  has a past surgical history that includes Cholecystectomy; Knee surgery; Knee surgery (Left); and Cesarean section.  Her family history includes Cancer in her father; Depression in her brother and mother; Fibromyalgia in her mother; Hypertension in her mother; Osteoarthritis in her father.  Review of Systems  Constitutional: Positive for  chills, diaphoresis, fever and malaise/fatigue.  HENT: Positive for ear pain and sore throat.   Respiratory: Positive for wheezing.   Cardiovascular: Negative for leg swelling.  Genitourinary: Negative for dysuria, frequency and urgency.  Musculoskeletal: Positive for myalgias.  Neurological: Positive for dizziness.    The problem list and medications were reviewed and updated by myself where necessary and exist elsewhere in the encounter.   OBJECTIVE:  BP 120/86 (BP Location: Right Arm, Patient Position: Sitting, Cuff Size: Large)    Pulse (!) 114    Temp (!) 101.2 F (38.4 C) (Oral)    Resp 17    Ht 5\' 4"  (1.626 m)    Wt 245 lb (111.1 kg)    LMP 03/03/2016    SpO2 99%    BMI 42.05 kg/m   Physical Exam  Constitutional: She appears ill.  HENT:  Right Ear: Tympanic membrane normal.  Left Ear: Tympanic membrane normal.  Nose: Nose normal. Right sinus exhibits no maxillary sinus tenderness and no frontal sinus tenderness. Left sinus exhibits no maxillary sinus tenderness and no frontal sinus tenderness.  A pus over her tonsil  Cardiovascular: Regular rhythm and normal heart sounds.  Tachycardia present.   No murmur heard. Pulmonary/Chest: Effort normal. No respiratory distress. She has wheezes in the left upper field.  Skin: She is diaphoretic.  Vitals reviewed.   Results for orders placed or performed in visit on 03/10/16 (from the past 72 hour(s))  POCT rapid strep A     Status: None   Collection Time: 03/10/16 10:26 AM  Result Value  Ref Range   Rapid Strep A Screen Negative Negative  POCT urine pregnancy     Status: None   Collection Time: 03/10/16 10:27 AM  Result Value Ref Range   Preg Test, Ur Negative Negative  POCT CBC     Status: Abnormal   Collection Time: 03/10/16 10:38 AM  Result Value Ref Range   WBC 14.6 (A) 4.6 - 10.2 K/uL   Lymph, poc 1.4 0.6 - 3.4   POC LYMPH PERCENT 9.9 (A) 10 - 50 %L   MID (cbc) 0.7 0 - 0.9   POC MID % 4.5 0 - 12 %M   POC Granulocyte  12.5 (A) 2 - 6.9   Granulocyte percent 85.6 (A) 37 - 80 %G   RBC 4.02 (A) 4.04 - 5.48 M/uL   Hemoglobin 12.6 12.2 - 16.2 g/dL   HCT, POC 16.1 (A) 09.6 - 47.9 %   MCV 88.2 80 - 97 fL   MCH, POC 31.3 (A) 27 - 31.2 pg   MCHC 35.5 (A) 31.8 - 35.4 g/dL   RDW, POC 04.5 %   Platelet Count, POC 220 142 - 424 K/uL   MPV 7.0 0 - 99.8 fL  POCT Influenza A/B     Status: None   Collection Time: 03/10/16 10:43 AM  Result Value Ref Range   Influenza A, POC Negative Negative   Influenza B, POC Negative Negative    Dg Chest 2 View  Result Date: 03/10/2016 CLINICAL DATA:  Cough. EXAM: CHEST  2 VIEW COMPARISON:  Radiographs of December 08, 2015. FINDINGS: The heart size and mediastinal contours are within normal limits. Both lungs are clear. No pneumothorax or pleural effusion is noted. The visualized skeletal structures are unremarkable. IMPRESSION: No active cardiopulmonary disease. Electronically Signed   By: Lupita Raider, M.D.   On: 03/10/2016 10:09    ASSESSMENT AND PLAN  Levy was seen today for sore throat, dizziness and medication refill.  Diagnoses and all orders for this visit:  Febrile illness Comments: She has a significant leukocytosis with mild exudate in her throat.  Strep negative.  Ceftriaxone here and z-pack.  RTC 2 days.  Orders: -     POCT Influenza A/B -     POCT rapid strep A -     Culture, Group A Strep -     DG Chest 2 View; Future -     POCT urine pregnancy -     POCT CBC -     azithromycin (ZITHROMAX) 250 MG tablet; Take two tabs today and one daily thereafter. -     cefTRIAXone (ROCEPHIN) injection 1,000 mg; Inject 1,000 mg into the muscle once.  Wheezing -     DG Chest 2 View; Future -     albuterol (PROVENTIL) (2.5 MG/3ML) 0.083% nebulizer solution 2.5 mg; Take 3 mLs (2.5 mg total) by nebulization now. -     ipratropium (ATROVENT) nebulizer solution 0.5 mg; Take 2.5 mLs (0.5 mg total) by nebulization once.  Medication refill -     DULoxetine (CYMBALTA) 60 MG  capsule; Take 1 capsule (60 mg total) by mouth daily. -     gabapentin (NEURONTIN) 100 MG capsule; Take 1 tablet in the morning and 2 tablets at bedtime.     The patient is advised to call or return to clinic if she does not see an improvement in symptoms, or to seek the care of the closest emergency department if she worsens with the above plan.   This note was  scribed in my presence and I performed the services described in the this documentation.   Deliah Boston, MHS, PA-C [facility] 03/10/2016 11:31 AM

## 2016-03-12 LAB — CULTURE, GROUP A STREP

## 2016-03-12 NOTE — Progress Notes (Signed)
Culture is postive. Please let her know to continue the medications I have prescribed.

## 2016-03-13 ENCOUNTER — Ambulatory Visit (INDEPENDENT_AMBULATORY_CARE_PROVIDER_SITE_OTHER): Payer: BLUE CROSS/BLUE SHIELD | Admitting: Physician Assistant

## 2016-03-13 VITALS — BP 132/82 | HR 69 | Temp 98.3°F | Resp 17 | Ht 64.0 in | Wt 244.0 lb

## 2016-03-13 DIAGNOSIS — R0981 Nasal congestion: Secondary | ICD-10-CM

## 2016-03-13 MED ORDER — CETIRIZINE-PSEUDOEPHEDRINE ER 5-120 MG PO TB12: 1.0000 | ORAL_TABLET | Freq: Two times a day (BID) | ORAL | 0 refills | Status: DC

## 2016-03-13 NOTE — Progress Notes (Signed)
03/13/2016 9:42 AM   DOB: 01/20/1976 / MRN: [id number]  SUBJECTIVE:  Lindsay Norman is a 40 y.o. female presenting for facial sinus pressure.  I saw her roughly 3 days ago for a sore throat and culture grew out non group A strep.  She reports her throat feels better today and she has one more day of Z-pack left to take.  She denies fever, HA, chills, nausea.   Complains that her nasal congestion is worse in her mother's home and she has a lot of dogs.  Her symptoms are improved with being outside.     She is allergic to celebrex [celecoxib] and penicillins.   She  has a past medical history of Fibromyalgia; Osteogenesis imperfecta; Osteopenia; Polycystic ovarian syndrome; and Scoliosis.    She  reports that she has been smoking Cigarettes.  She has been smoking about 1.00 pack per day. She has never used smokeless tobacco. She reports that she does not drink alcohol or use drugs. She  reports that she currently engages in sexual activity. The patient  has a past surgical history that includes Cholecystectomy; Knee surgery; Knee surgery (Left); and Cesarean section.  Her family history includes Cancer in her father; Depression in her brother and mother; Fibromyalgia in her mother; Hypertension in her mother; Osteoarthritis in her father.  Review of Systems  Constitutional: Negative for chills and fever.  HENT: Positive for congestion. Negative for ear pain and sore throat.   Respiratory: Negative for cough, shortness of breath and wheezing.   Musculoskeletal: Negative for myalgias.  Neurological: Negative for dizziness and headaches.    The problem list and medications were reviewed and updated by myself where necessary and exist elsewhere in the encounter.   OBJECTIVE:  BP 132/82 (BP Location: Right Arm, Patient Position: Sitting, Cuff Size: Large)   Pulse 69   Temp 98.3 F (36.8 C) (Oral)   Resp 17   Ht 5\' 4"  (1.626 m)   Wt 244 lb (110.7 kg)   LMP 03/03/2016   SpO2 99%   BMI  41.88 kg/m   Physical Exam  Constitutional: She is oriented to person, place, and time.  HENT:  Right Ear: External ear normal.  Left Ear: External ear normal.  Nose: Mucosal edema present. Right sinus exhibits no maxillary sinus tenderness and no frontal sinus tenderness. Left sinus exhibits no maxillary sinus tenderness and no frontal sinus tenderness.  Mouth/Throat: Oropharynx is clear and moist. No oropharyngeal exudate.  Eyes: Conjunctivae are normal. Pupils are equal, round, and reactive to light.  Cardiovascular: Regular rhythm and normal heart sounds.   Pulmonary/Chest: Effort normal and breath sounds normal. She has no wheezes. She has no rales.  Neurological: She is alert and oriented to person, place, and time.  Skin: Skin is warm and dry. No rash noted. She is not diaphoretic. No erythema.  Psychiatric: Her behavior is normal.    Results for orders placed or performed in visit on 03/10/16 (from the past 72 hour(s))  Culture, Group A Strep     Status: None   Collection Time: 03/10/16 10:23 AM  Result Value Ref Range   Organism ID, Bacteria Moderate STREPTOCOCCUS BETA HEMOLYTIC NOT GROUP A     Comment: Beta hemolytic streptococci are predictably susceptible to penicillin and other beta-lactams. Susceptibility testing not routinely performed.   POCT rapid strep A     Status: None   Collection Time: 03/10/16 10:26 AM  Result Value Ref Range   Rapid Strep A Screen Negative  Negative  POCT urine pregnancy     Status: None   Collection Time: 03/10/16 10:27 AM  Result Value Ref Range   Preg Test, Ur Negative Negative  POCT CBC     Status: Abnormal   Collection Time: 03/10/16 10:38 AM  Result Value Ref Range   WBC 14.6 (A) 4.6 - 10.2 K/uL   Lymph, poc 1.4 0.6 - 3.4   POC LYMPH PERCENT 9.9 (A) 10 - 50 %L   MID (cbc) 0.7 0 - 0.9   POC MID % 4.5 0 - 12 %M   POC Granulocyte 12.5 (A) 2 - 6.9   Granulocyte percent 85.6 (A) 37 - 80 %G   RBC 4.02 (A) 4.04 - 5.48 M/uL    Hemoglobin 12.6 12.2 - 16.2 g/dL   HCT, POC 91.4 (A) 78.2 - 47.9 %   MCV 88.2 80 - 97 fL   MCH, POC 31.3 (A) 27 - 31.2 pg   MCHC 35.5 (A) 31.8 - 35.4 g/dL   RDW, POC 95.6 %   Platelet Count, POC 220 142 - 424 K/uL   MPV 7.0 0 - 99.8 fL  POCT Influenza A/B     Status: None   Collection Time: 03/10/16 10:43 AM  Result Value Ref Range   Influenza A, POC Negative Negative   Influenza B, POC Negative Negative    No results found.  ASSESSMENT AND PLAN  Ghalia was seen today for sore throat and shortness of breath.  Diagnoses and all orders for this visit:  Nasal congestion: Her sore throat is resolving.  She has one more dose of z-pack.  She is complaining of nasal congestion today.  I have advised zyrtec-d for the next few days and also that she resume her normal allergy regimen.  RTC if symptoms fail to abate.  -     cetirizine-pseudoephedrine (ZYRTEC-D ALLERGY & CONGESTION) 5-120 MG tablet; Take 1 tablet by mouth 2 (two) times daily.    The patient is advised to call or return to clinic if she does not see an improvement in symptoms, or to seek the care of the closest emergency department if she worsens with the above plan.   Deliah Boston, MHS, PA-C [facility] 03/13/2016 9:42 AM

## 2016-03-13 NOTE — Patient Instructions (Signed)
     IF you received an x-ray today, you will receive an invoice from [facility]. Please contact  Radiology at [phone] with questions or concerns regarding your invoice.   IF you received labwork today, you will receive an invoice from [facility]. Please contact [facility] at [phone] with questions or concerns regarding your invoice.   Our billing staff will not be able to assist you with questions regarding bills from these companies.  You will be contacted with the lab results as soon as they are available. The fastest way to get your results is to activate your My Chart account. Instructions are located on the last page of this paperwork. If you have not heard from us regarding the results in 2 weeks, please contact this office.      

## 2016-05-02 ENCOUNTER — Other Ambulatory Visit: Payer: Self-pay | Admitting: Physician Assistant

## 2016-05-02 DIAGNOSIS — Z760 Encounter for issue of repeat prescription: Secondary | ICD-10-CM

## 2016-09-08 ENCOUNTER — Ambulatory Visit (INDEPENDENT_AMBULATORY_CARE_PROVIDER_SITE_OTHER): Payer: BLUE CROSS/BLUE SHIELD

## 2016-09-08 ENCOUNTER — Ambulatory Visit (INDEPENDENT_AMBULATORY_CARE_PROVIDER_SITE_OTHER): Payer: BLUE CROSS/BLUE SHIELD | Admitting: Student

## 2016-09-08 VITALS — BP 153/81 | HR 85 | Temp 98.3°F | Resp 18 | Ht 62.6 in | Wt 247.4 lb

## 2016-09-08 DIAGNOSIS — R05 Cough: Secondary | ICD-10-CM

## 2016-09-08 DIAGNOSIS — Z720 Tobacco use: Secondary | ICD-10-CM | POA: Diagnosis not present

## 2016-09-08 DIAGNOSIS — R042 Hemoptysis: Secondary | ICD-10-CM

## 2016-09-08 DIAGNOSIS — R5383 Other fatigue: Secondary | ICD-10-CM | POA: Diagnosis not present

## 2016-09-08 DIAGNOSIS — Z6841 Body Mass Index (BMI) 40.0 and over, adult: Secondary | ICD-10-CM

## 2016-09-08 DIAGNOSIS — J320 Chronic maxillary sinusitis: Secondary | ICD-10-CM | POA: Diagnosis not present

## 2016-09-08 DIAGNOSIS — R059 Cough, unspecified: Secondary | ICD-10-CM | POA: Insufficient documentation

## 2016-09-08 MED ORDER — TRIAMCINOLONE ACETONIDE 55 MCG/ACT NA AERO: 2.0000 | INHALATION_SPRAY | Freq: Every day | NASAL | 12 refills | Status: DC

## 2016-09-08 NOTE — Assessment & Plan Note (Signed)
Recommended quitting, continue to decrease cigarettes per day.

## 2016-09-08 NOTE — Assessment & Plan Note (Signed)
Negative chest x-ray, recommended quitting smoking as she is trying.  Likely due to nasal irritation from allergies.  Trying vasoline, nasacort instead of flonase.

## 2016-09-08 NOTE — Progress Notes (Signed)
Subjective:    Patient ID: Lindsay Norman, female    DOB: [birth year], 41 y.o.   MRN: [id number]  HPI Has been coughing for a while because of where she works.  Has nasal congestion that has been chronic, worse over past month. Has not had a sore throat.  Uses flonase.  Has had episodes where she has required antibiotics 4 times in the past year.   Cough productive for a month, seems to be worse with nasal congestion.  Pink tinged sputum, only a little bit.  She states that she usually has "sinus issues". Denies weight loss, fevers, chills.   Not worse when laying down to sleep at night.   Smoking, but is actively quitting with smoking <1/2 ppd, but was doing 1 ppd starting 15 years ago. No alcohol use, recreational drug use.   Fatigue for 4-6 weeks.  PMHx - Updated and reviewed.  Contributory factors include: Negative PSHx - Updated and reviewed.  Contributory factors include:  Negative FHx - Updated and reviewed.  Contributory factors include:  Negative Social Hx - Updated and reviewed. Contributory factors include: Negative Medications - reviewed    Review of Systems  Constitutional: Positive for fatigue. Negative for activity change, appetite change, chills, fever and unexpected weight change.  HENT: Positive for congestion, postnasal drip and rhinorrhea. Negative for sore throat and voice change.   Respiratory: Positive for cough. Negative for chest tightness and shortness of breath.   Cardiovascular: Negative for chest pain, palpitations and leg swelling.  Gastrointestinal: Negative for abdominal pain, nausea and vomiting.  Endocrine: Positive for heat intolerance. Negative for cold intolerance.  Skin: Negative for rash and wound.  Psychiatric/Behavioral: Negative for agitation and confusion.  All other systems reviewed and are negative.      Objective:   Physical Exam  Constitutional: She is oriented to person, place, and time. She appears well-developed and  well-nourished. No distress.  HENT:  Head: Normocephalic and atraumatic.  Right Ear: External ear normal.  Left Ear: External ear normal.  Mouth/Throat: No oropharyngeal exudate.  Clear discharge with pale, boggy nasal mucosa  Eyes: Conjunctivae are normal. Pupils are equal, round, and reactive to light.  Neck: Normal range of motion. Neck supple. No thyromegaly present.  Cardiovascular: Normal rate, regular rhythm, normal heart sounds and intact distal pulses.  Exam reveals no gallop and no friction rub.   No murmur heard. Pulmonary/Chest: Effort normal and breath sounds normal. No stridor. No respiratory distress. She has no wheezes. She has no rales. She exhibits no tenderness.  Musculoskeletal: Normal range of motion. She exhibits no edema.  Lymphadenopathy:    She has no cervical adenopathy.  Neurological: She is alert and oriented to person, place, and time.  Skin: Skin is warm. No rash noted. She is not diaphoretic. No erythema.  Psychiatric: She has a normal mood and affect. Her behavior is normal. Judgment and thought content normal.  Nursing note and vitals reviewed.   BP (!) 153/81   Pulse 85   Temp 98.3 F (36.8 C) (Oral)   Resp 18   Ht 5' 2.6" (1.59 m)   Wt 247 lb 6.4 oz (112.2 kg)   LMP 08/26/2016 (Approximate)   SpO2 96%   BMI 44.39 kg/m        Assessment & Plan:  Chronic maxillary sinusitis Has needed antibiotics 4 times this past year.  She does not need them today.  Will get CT sinuses and refer to ENT.  Likely allergy related.  Switching to nasacort, vasoline in nares, humidifier.    Fatigue Due to hemoptysis, got chest x-ray which was normal to check for lung mass.  Will also check TSH, Vitamin D, CBC, CMP.  Will follow up your results next week.    Hemoptysis Negative chest x-ray, recommended quitting smoking as she is trying.  Likely due to nasal irritation from allergies.  Trying vasoline, nasacort instead of flonase.  Adiposity Recommend weight  loss through diet and exercise.  Tobacco abuse Recommended quitting, continue to decrease cigarettes per day.

## 2016-09-08 NOTE — Assessment & Plan Note (Signed)
Recommend weight loss through diet and exercise. 

## 2016-09-08 NOTE — Assessment & Plan Note (Signed)
Due to hemoptysis, got chest x-ray which was normal to check for lung mass.  Will also check TSH, Vitamin D, CBC, CMP.  Will follow up your results next week.

## 2016-09-08 NOTE — Patient Instructions (Addendum)
They will call you about CT scan.  Referral to Ears, Nose and Throat placed  Use humidifier at night, can try vasoline in nares as well.    IF you received an x-ray today, you will receive an invoice from [facility]. Please contact [facility] at [phone] with questions or concerns regarding your invoice.   IF you received labwork today, you will receive an invoice from [facility]. Please contact [facility] at [phone] with questions or concerns regarding your invoice.   Our billing staff will not be able to assist you with questions regarding bills from these companies.  You will be contacted with the lab results as soon as they are available. The fastest way to get your results is to activate your My Chart account. Instructions are located on the last page of this paperwork. If you have not heard from [place] regarding the results in 2 weeks, please contact this office.    ]

## 2016-09-08 NOTE — Assessment & Plan Note (Signed)
Has needed antibiotics 4 times this past year.  She does not need them today.  Will get CT sinuses and refer to ENT.  Likely allergy related.  Switching to nasacort, vasoline in nares, humidifier.

## 2016-09-09 LAB — COMPREHENSIVE METABOLIC PANEL
A/G RATIO: 1.9 (ref 1.2–2.2)
ALT: 13 IU/L (ref 0–32)
AST: 16 IU/L (ref 0–40)
Albumin: 4.4 g/dL (ref 3.5–5.5)
Alkaline Phosphatase: 72 IU/L (ref 39–117)
BUN/Creatinine Ratio: 17 (ref 9–23)
BUN: 13 mg/dL (ref 6–20)
Bilirubin Total: <0.2 mg/dL (ref 0.0–1.2)
CALCIUM: 9.1 mg/dL (ref 8.7–10.2)
CO2: 21 mmol/L (ref 18–29)
Chloride: 98 mmol/L (ref 96–106)
Creatinine, Ser: 0.77 mg/dL (ref 0.57–1.00)
GFR, EST AFRICAN AMERICAN: 112 mL/min/{1.73_m2} (ref >59)
GFR, EST NON AFRICAN AMERICAN: 98 mL/min/{1.73_m2} (ref >59)
GLUCOSE: 82 mg/dL (ref 65–99)
Globulin, Total: 2.3 g/dL (ref 1.5–4.5)
Potassium: 4.3 mmol/L (ref 3.5–5.2)
Sodium: 135 mmol/L (ref 134–144)
TOTAL PROTEIN: 6.7 g/dL (ref 6.0–8.5)

## 2016-09-09 LAB — TSH: TSH: 1.89 u[IU]/mL (ref 0.450–4.500)

## 2016-09-09 LAB — VITAMIN D 25 HYDROXY (VIT D DEFICIENCY, FRACTURES): Vit D, 25-Hydroxy: 12 ng/mL — ABNORMAL LOW (ref 30.0–100.0)

## 2016-09-09 LAB — CBC WITH DIFFERENTIAL/PLATELET
BASOS ABS: 0 10*3/uL (ref 0.0–0.2)
Basos: 0 %
EOS (ABSOLUTE): 0.1 10*3/uL (ref 0.0–0.4)
EOS: 1 %
HEMATOCRIT: 38.7 % (ref 34.0–46.6)
HEMOGLOBIN: 13 g/dL (ref 11.1–15.9)
IMMATURE GRANS (ABS): 0 10*3/uL (ref 0.0–0.1)
Immature Granulocytes: 0 %
LYMPHS: 22 %
Lymphocytes Absolute: 2 10*3/uL (ref 0.7–3.1)
MCH: 30.3 pg (ref 26.6–33.0)
MCHC: 33.6 g/dL (ref 31.5–35.7)
MCV: 90 fL (ref 79–97)
MONOCYTES: 5 %
Monocytes Absolute: 0.5 10*3/uL (ref 0.1–0.9)
Neutrophils Absolute: 6.4 10*3/uL (ref 1.4–7.0)
Neutrophils: 72 %
Platelets: 260 10*3/uL (ref 150–379)
RBC: 4.29 x10E6/uL (ref 3.77–5.28)
RDW: 13.7 % (ref 12.3–15.4)
WBC: 9 10*3/uL (ref 3.4–10.8)

## 2016-09-10 ENCOUNTER — Other Ambulatory Visit: Payer: Self-pay | Admitting: Physician Assistant

## 2016-09-10 DIAGNOSIS — Z760 Encounter for issue of repeat prescription: Secondary | ICD-10-CM

## 2016-09-12 NOTE — Telephone Encounter (Signed)
Prescription pool req refill-gave 3 months and asked for follow up

## 2016-09-15 ENCOUNTER — Telehealth: Payer: Self-pay | Admitting: Student

## 2016-09-15 DIAGNOSIS — E559 Vitamin D deficiency, unspecified: Secondary | ICD-10-CM

## 2016-09-15 MED ORDER — VITAMIN D (ERGOCALCIFEROL) 1.25 MG (50000 UNIT) PO CAPS: 50000.0000 [IU] | ORAL_CAPSULE | ORAL | 0 refills | Status: DC

## 2016-09-15 NOTE — Telephone Encounter (Signed)
Called pt and left message to call back to review labs. Vitamin D low, needs replacement.  8 weeks high dose, then 800 units daily afterwards.   Follow up 2 months for recheck

## 2016-09-17 ENCOUNTER — Other Ambulatory Visit: Payer: Self-pay | Admitting: Physician Assistant

## 2016-09-17 ENCOUNTER — Telehealth: Payer: Self-pay

## 2016-09-17 DIAGNOSIS — Z760 Encounter for issue of repeat prescription: Secondary | ICD-10-CM

## 2016-09-17 NOTE — Telephone Encounter (Signed)
(  I COULD NOT ATTACH THIS MESSAGE TO DR. Helmut MusterALICIA CHITANAND'S MESSAGE BECAUSE THE ENCOUNTER WAS CLOSED). DR. Rory PercyHITANAND SAW THE PATIENT A WEEK AGO AND SHE LEFT HER A MESSAGE ON TUESDAY TO CALL BACK TO GET HER LAB RESULTS. BEST PHONE [phone] (CELL) [facility] [address]. MBC

## 2016-09-17 NOTE — Telephone Encounter (Signed)
See nl results except low D

## 2016-09-17 NOTE — Telephone Encounter (Signed)
Pt calling to see if she can get a refill of Cymbalta. She said she had her mom pick up all other prescriptions today. Pt said generally her Cymbalta refills are called in to pharmacy but she did not have anything for the Cymbalta when her prescriptions were picked up. Pt said she needs to take the cymbalta with the gabapentin. Best pt callback number is [phone].

## 2016-09-17 NOTE — Telephone Encounter (Signed)
09/08/16 last ov

## 2016-09-18 MED ORDER — DULOXETINE HCL 60 MG PO CPEP: 60.0000 mg | ORAL_CAPSULE | Freq: Every day | ORAL | 0 refills | Status: DC

## 2016-09-18 MED ORDER — GABAPENTIN 100 MG PO CAPS: ORAL_CAPSULE | ORAL | 0 refills | Status: DC

## 2016-09-18 NOTE — Telephone Encounter (Signed)
Called and left message again.  Lab results show Vitamin D deficiency, called in Vitamin D tablets to take once a week.  Also called in cymbalta and neurontin, a 1 month supply.  Would recommend following up with same provider for these medications to provide good pt care.    Signed,  Corliss MarcusAlicia Barnes, DO [facility] 11:57 AM

## 2016-10-26 ENCOUNTER — Other Ambulatory Visit: Payer: Self-pay | Admitting: Student

## 2016-10-26 DIAGNOSIS — Z760 Encounter for issue of repeat prescription: Secondary | ICD-10-CM

## 2016-10-26 NOTE — Telephone Encounter (Signed)
Pt calling for a refill on cymbalta

## 2017-01-13 ENCOUNTER — Other Ambulatory Visit: Payer: Self-pay | Admitting: Orthopedic Surgery

## 2017-01-13 DIAGNOSIS — M25551 Pain in right hip: Secondary | ICD-10-CM

## 2017-03-01 ENCOUNTER — Encounter: Payer: Self-pay | Admitting: Physician Assistant

## 2017-03-01 ENCOUNTER — Ambulatory Visit (INDEPENDENT_AMBULATORY_CARE_PROVIDER_SITE_OTHER): Payer: BLUE CROSS/BLUE SHIELD

## 2017-03-01 ENCOUNTER — Ambulatory Visit (INDEPENDENT_AMBULATORY_CARE_PROVIDER_SITE_OTHER): Payer: BLUE CROSS/BLUE SHIELD | Admitting: Physician Assistant

## 2017-03-01 VITALS — BP 134/84 | HR 101 | Temp 98.5°F | Resp 16 | Ht 62.6 in | Wt 253.0 lb

## 2017-03-01 DIAGNOSIS — R059 Cough, unspecified: Secondary | ICD-10-CM

## 2017-03-01 DIAGNOSIS — R52 Pain, unspecified: Secondary | ICD-10-CM | POA: Diagnosis not present

## 2017-03-01 DIAGNOSIS — R05 Cough: Secondary | ICD-10-CM | POA: Diagnosis not present

## 2017-03-01 LAB — POCT CBC
Granulocyte percent: 80.2 %G — AB (ref 37–80)
HEMATOCRIT: 37.6 % — AB (ref 37.7–47.9)
Hemoglobin: 12.7 g/dL (ref 12.2–16.2)
Lymph, poc: 1.7 (ref 0.6–3.4)
MCH: 29.9 pg (ref 27–31.2)
MCHC: 33.8 g/dL (ref 31.8–35.4)
MCV: 88.4 fL (ref 80–97)
MID (CBC): 0.5 (ref 0–0.9)
MPV: 6.8 fL (ref 0–99.8)
POC Granulocyte: 8.9 — AB (ref 2–6.9)
POC LYMPH %: 15.3 % (ref 10–50)
POC MID %: 4.5 %M (ref 0–12)
Platelet Count, POC: 280 10*3/uL (ref 142–424)
RBC: 4.26 M/uL (ref 4.04–5.48)
RDW, POC: 12.7 %
WBC: 11.1 10*3/uL — AB (ref 4.6–10.2)

## 2017-03-01 LAB — POCT URINALYSIS DIP (MANUAL ENTRY)
Bilirubin, UA: NEGATIVE
GLUCOSE UA: NEGATIVE mg/dL
Ketones, POC UA: NEGATIVE mg/dL
LEUKOCYTES UA: NEGATIVE
Nitrite, UA: NEGATIVE
Protein Ur, POC: NEGATIVE mg/dL
Spec Grav, UA: 1.01 (ref 1.010–1.025)
UROBILINOGEN UA: 0.2 U/dL
pH, UA: 6 (ref 5.0–8.0)

## 2017-03-01 LAB — POCT RAPID STREP A (OFFICE): RAPID STREP A SCREEN: NEGATIVE

## 2017-03-01 LAB — POCT INFLUENZA A/B
Influenza A, POC: NEGATIVE
Influenza B, POC: NEGATIVE

## 2017-03-01 MED ORDER — DOXYCYCLINE HYCLATE 100 MG PO CAPS: 100.0000 mg | ORAL_CAPSULE | Freq: Two times a day (BID) | ORAL | 0 refills | Status: AC

## 2017-03-01 NOTE — Progress Notes (Signed)
03/01/2017 12:56 PM   DOB: Jul 02, 1975 / MRN: [id number]  SUBJECTIVE:  Lindsay Norman is a 41 y.o. female presenting for malaise and tactile fever that yesterday.  She associates myalgia, cough, and sore throat.  Denies any significant HA.  No known sick contacts.   She is allergic to celebrex [celecoxib] and penicillins.   She  has a past medical history of Fibromyalgia; Osteogenesis imperfecta; Osteopenia; Polycystic ovarian syndrome; and Scoliosis.    She  reports that she has been smoking Cigarettes.  She has been smoking about 1.00 pack per day. She has never used smokeless tobacco. She reports that she does not drink alcohol or use drugs. She  reports that she currently engages in sexual activity. The patient  has a past surgical history that includes Cholecystectomy; Knee surgery; Knee surgery (Left); and Cesarean section.  Her family history includes Cancer in her father; Depression in her brother and mother; Fibromyalgia in her mother; Hypertension in her mother; Osteoarthritis in her father.  Review of Systems  Constitutional: Positive for chills and fever. Negative for diaphoresis.  Eyes: Negative.   Respiratory: Negative for shortness of breath.   Cardiovascular: Negative for chest pain, orthopnea and leg swelling.  Gastrointestinal: Negative for abdominal pain, blood in stool, constipation, diarrhea, heartburn, melena, nausea and vomiting.  Genitourinary: Negative for flank pain.  Skin: Negative for rash.  Neurological: Negative for dizziness, sensory change, speech change, focal weakness and headaches.    The problem list and medications were reviewed and updated by myself where necessary and exist elsewhere in the encounter.   OBJECTIVE:  BP 134/84 (BP Location: Left Arm, Patient Position: Sitting, Cuff Size: Large)   Pulse (!) 101   Temp 98.5 F (36.9 C) (Oral)   Resp 16   Ht 5' 2.6" (1.59 m)   Wt 253 lb (114.8 kg)   SpO2 98%   BMI 45.39 kg/m   Physical  Exam  Constitutional: She is oriented to person, place, and time. She is active.  Non-toxic appearance.  Eyes: Pupils are equal, round, and reactive to light. EOM are normal.  Cardiovascular: Normal rate, regular rhythm, S1 normal, S2 normal, normal heart sounds and intact distal pulses.  Exam reveals no gallop, no friction rub and no decreased pulses.   No murmur heard. Pulmonary/Chest: Effort normal. No stridor. No tachypnea. No respiratory distress. She has no wheezes. She has no rales.  Abdominal: She exhibits no distension.  Musculoskeletal: She exhibits no edema.  Neurological: She is alert and oriented to person, place, and time. She has normal strength and normal reflexes. She is not disoriented. She displays no atrophy. No cranial nerve deficit or sensory deficit. She exhibits normal muscle tone. Coordination and gait normal.  Skin: Skin is warm and dry. She is not diaphoretic. No pallor.  Psychiatric: Her behavior is normal.    Results for orders placed or performed in visit on 03/01/17 (from the past 72 hour(s))  POCT Influenza A/B     Status: Normal   Collection Time: 03/01/17 12:05 PM  Result Value Ref Range   Influenza A, POC Negative Negative   Influenza B, POC Negative Negative  POCT CBC     Status: Abnormal   Collection Time: 03/01/17 12:35 PM  Result Value Ref Range   WBC 11.1 (A) 4.6 - 10.2 K/uL   Lymph, poc 1.7 0.6 - 3.4   POC LYMPH PERCENT 15.3 10 - 50 %L   MID (cbc) 0.5 0 - 0.9   POC  MID % 4.5 0 - 12 %M   POC Granulocyte 8.9 (A) 2 - 6.9   Granulocyte percent 80.2 (A) 37 - 80 %G   RBC 4.26 4.04 - 5.48 M/uL   Hemoglobin 12.7 12.2 - 16.2 g/dL   HCT, POC 78.4 (A) 69.6 - 47.9 %   MCV 88.4 80 - 97 fL   MCH, POC 29.9 27 - 31.2 pg   MCHC 33.8 31.8 - 35.4 g/dL   RDW, POC 29.5 %   Platelet Count, POC 280 142 - 424 K/uL   MPV 6.8 0 - 99.8 fL  POCT urinalysis dipstick     Status: Abnormal   Collection Time: 03/01/17 12:46 PM  Result Value Ref Range   Color, UA  yellow yellow   Clarity, UA clear clear   Glucose, UA negative negative mg/dL   Bilirubin, UA negative negative   Ketones, POC UA negative negative mg/dL   Spec Grav, UA 2.841 3.244 - 1.025   Blood, UA moderate (A) negative   pH, UA 6.0 5.0 - 8.0   Protein Ur, POC negative negative mg/dL   Urobilinogen, UA 0.2 0.2 or 1.0 E.U./dL   Nitrite, UA Negative Negative   Leukocytes, UA Negative Negative    Dg Chest 2 View  Result Date: 03/01/2017 CLINICAL DATA:  Cough, elevated white blood cell count, current smoker. EXAM: CHEST  2 VIEW COMPARISON:  Chest x-ray of [place], 2018 FINDINGS: The lungs are adequately inflated. There is no focal infiltrate. There is no pleural effusion. The heart and pulmonary vascularity are normal. The mediastinum is normal in width. The observed bony thorax is unremarkable. IMPRESSION: There is no active cardiopulmonary disease. Electronically Signed   By: David  [place] M.D.   On: 03/01/2017 12:53    ASSESSMENT AND PLAN:  [name] was seen today for dizziness.  Diagnoses and all orders for this visit:  Body aches: Flu unlikely given elevated white count.  Given this, will cover for an atypical pneumonia with doxy.  Preg negative.  -     Cancel: Influenza A/B -     POCT Influenza A/B -     POCT CBC -     POCT urinalysis dipstick -     DG Chest 2 View; Future -     POCT rapid strep A -     Culture, Group A Strep  Other orders -     doxycycline (VIBRAMYCIN) 100 MG capsule; Take 1 capsule (100 mg total) by mouth 2 (two) times daily.    The patient is advised to call or return to clinic if she does not see an improvement in symptoms, or to seek the care of the closest emergency department if she worsens with the above plan.   Deliah Boston, MHS, PA-C Primary Care at [facility] 03/01/2017 12:56 PM

## 2017-03-01 NOTE — Patient Instructions (Addendum)
Take tylenol 1000 mg every 8 hours     IF you received an x-ray today, you will receive an invoice from [facility]. Please contact [facility] at [phone] with questions or concerns regarding your invoice.   IF you received labwork today, you will receive an invoice from [facility]. Please contact [facility] at [phone] with questions or concerns regarding your invoice.   Our billing staff will not be able to assist you with questions regarding bills from these companies.  You will be contacted with the lab results as soon as they are available. The fastest way to get your results is to activate your My Chart account. Instructions are located on the last page of this paperwork. If you have not heard from [place] regarding the results in 2 weeks, please contact this office.

## 2017-03-04 LAB — CULTURE, GROUP A STREP

## 2017-03-04 NOTE — Progress Notes (Signed)
Tried to call.  She has non group A strep in the throat.  If she is not better on abx please let me know so I can send her a new med in.  May need to send a letter as phone was busy. Deliah BostonMichael Mannie Ohlin, MS, PA-C 6:33 PM, 03/04/2017

## 2017-07-22 ENCOUNTER — Other Ambulatory Visit: Payer: Self-pay | Admitting: Occupational Medicine

## 2017-07-22 ENCOUNTER — Ambulatory Visit: Payer: Self-pay

## 2017-07-22 DIAGNOSIS — M25551 Pain in right hip: Secondary | ICD-10-CM

## 2017-07-22 DIAGNOSIS — M25532 Pain in left wrist: Secondary | ICD-10-CM

## 2019-02-09 ENCOUNTER — Other Ambulatory Visit: Payer: Self-pay | Admitting: Sports Medicine

## 2019-02-09 DIAGNOSIS — M25571 Pain in right ankle and joints of right foot: Secondary | ICD-10-CM

## 2019-02-10 ENCOUNTER — Ambulatory Visit (INDEPENDENT_AMBULATORY_CARE_PROVIDER_SITE_OTHER): Payer: BC Managed Care – PPO | Admitting: Sports Medicine

## 2019-02-10 ENCOUNTER — Ambulatory Visit (INDEPENDENT_AMBULATORY_CARE_PROVIDER_SITE_OTHER): Payer: BC Managed Care – PPO

## 2019-02-10 ENCOUNTER — Encounter: Payer: Self-pay | Admitting: Sports Medicine

## 2019-02-10 ENCOUNTER — Other Ambulatory Visit: Payer: Self-pay

## 2019-02-10 DIAGNOSIS — M779 Enthesopathy, unspecified: Secondary | ICD-10-CM | POA: Diagnosis not present

## 2019-02-10 DIAGNOSIS — M7751 Other enthesopathy of right foot: Secondary | ICD-10-CM | POA: Diagnosis not present

## 2019-02-10 DIAGNOSIS — M25571 Pain in right ankle and joints of right foot: Secondary | ICD-10-CM | POA: Diagnosis not present

## 2019-02-10 DIAGNOSIS — M19071 Primary osteoarthritis, right ankle and foot: Secondary | ICD-10-CM

## 2019-02-10 MED ORDER — TRIAMCINOLONE ACETONIDE 10 MG/ML IJ SUSP
10.0000 mg | Freq: Once | INTRAMUSCULAR | Status: AC
  Administered 2019-02-10: 10 mg

## 2019-02-10 NOTE — Progress Notes (Signed)
Subjective:  Neala D Pautler is a 43 y.o. female patient who presents to office for evaluation of right ankle pain. Patient complains of continued pain in the ankle for the last 1 to 2 weeks. Patient has tried crutches Tylenol and BC powder with no relief in symptoms.  Reports that pain is pretty severe with swelling and throbbing along the lateral side of her foot and ankle reports that the pain radiates to the back as well pain is 7 out of 10 admits to a history of significant knee problems with a meniscal tear on the right knee that still has not improved and reports that she went through therapy and previous surgery without improvement of her knee and reports that she is supposed to return back to work in November but started having more ankle pain and has not been able to walk has been using a crutch.  Patient denies any current injury but does admit to a history of a injury to her right ankle in 2013.  Patient denies any significant redness or warmth to the area.  Patient denies any other pedal complaints. Denies any recent injury/trip/fall/sprain/any other causative factors.   Review of Systems  All other systems reviewed and are negative.    Patient Active Problem List   Diagnosis Date Noted  . Vitamin D deficiency 09/15/2016  . Hemoptysis 09/08/2016  . Fatigue 09/08/2016  . Cough 09/08/2016  . 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 Fibromyalgia 09/23/2015  . Tobacco abuse 09/23/2015  . Avascular necrosis of bone of hip (HCC) 12/20/2004  . Adiposity 12/21/1998    Current Outpatient Medications on File Prior to Visit  Medication Sig Dispense Refill  . DULoxetine (CYMBALTA) 60 MG capsule Take 60 mg by mouth daily.    Marland Kitchen gabapentin (NEURONTIN) 100 MG capsule TAKE 1 CAPSULE IN THE MORNING AND 2 CAPSULES AT BEDTIME. 90 capsule 0   No current facility-administered medications on file prior to visit.     Allergies  Allergen Reactions  . Celebrex [Celecoxib]   . Penicillins      Objective:  General: Alert and oriented x3 in no acute distress  Dermatology: No open lesions bilateral lower extremities, no webspace macerations, no ecchymosis bilateral, all nails x 10 are well manicured.  Vascular: Dorsalis Pedis and Posterior Tibial pedal pulses palpable, Capillary Fill Time 3 seconds,(+) pedal hair growth bilateral, no edema bilateral lower extremities, Temperature gradient within normal limits.  Neurology: Michaell Cowing sensation intact via light touch bilateral.  Musculoskeletal: Mild to moderate tenderness with palpation at right lateral ankle along the sinus tarsi and peroneal tendon course there is also some mild pain noted at the Achilles insertion on the lateral aspect as well. Negative talar tilt, Negative tib-fib stress, No instability however there is now grinding with range of motion at the right ankle. No pain with calf compression bilateral. Strength within normal limits in all groups bilateral.   Gait: Antalgic gait crutch assisted  Xrays  Right ankle   Impression: Significant joint space narrowing and arthritis noted at the right ankle, no fracture or significant dislocation of joint noted.  Mild soft tissue swelling.  No other acute findings.  Assessment and Plan: Problem List Items Addressed This Visit    None    Visit Diagnoses    Tendonitis    -  Primary   Relevant Medications   triamcinolone acetonide (KENALOG) 10 MG/ML injection 10 mg (Completed) (Start on 02/10/2019 12:15 PM)   Capsulitis of ankle, right  Relevant Medications   triamcinolone acetonide (KENALOG) 10 MG/ML injection 10 mg (Completed) (Start on 02/10/2019 12:15 PM)   Acute right ankle pain       Arthritis of right ankle       Relevant Medications   triamcinolone acetonide (KENALOG) 10 MG/ML injection 10 mg (Completed) (Start on 02/10/2019 12:15 PM)     -Complete examination performed -Xrays reviewed -Discussed treatement options tendinitis versus capsulitis with underlying  arthritis at right ankle -After oral consent and aseptic prep, injected a mixture containing 1 ml of 2% plain lidocaine, 1 ml 0.5% plain marcaine, 0.5 ml of kenalog 10 and 0.5 ml of dexamethasone phosphate into right lateral ankle without complication. Post-injection care discussed with patient.  -Dispensed cam boot for patient to use every day for the next 2 weeks and may slowly wean from crutches as tolerated -Dispensed compression sleeve for patient to use during the day to help with swelling and edema control -Advised patient to call office I can send an anti-inflammatory for patient to take if she is not getting any relief from what I did today -Patient to return to office as in 2 weeks or sooner if condition worsens.  Landis Martins, DPM

## 2019-02-15 ENCOUNTER — Other Ambulatory Visit: Payer: Self-pay | Admitting: Sports Medicine

## 2019-02-15 DIAGNOSIS — M25571 Pain in right ankle and joints of right foot: Secondary | ICD-10-CM

## 2019-02-15 DIAGNOSIS — M779 Enthesopathy, unspecified: Secondary | ICD-10-CM

## 2019-02-20 ENCOUNTER — Telehealth: Payer: Self-pay | Admitting: Sports Medicine

## 2019-02-20 ENCOUNTER — Other Ambulatory Visit: Payer: Self-pay | Admitting: Sports Medicine

## 2019-02-20 MED ORDER — TRAMADOL HCL 50 MG PO TABS: 50.0000 mg | ORAL_TABLET | Freq: Three times a day (TID) | ORAL | 0 refills | Status: AC | PRN

## 2019-02-20 NOTE — Telephone Encounter (Signed)
Sent Tramadol to her pharmacy

## 2019-02-20 NOTE — Progress Notes (Signed)
Sent Tramadol to pharmacy for pain this once -Dr. Cannon Kettle

## 2019-02-20 NOTE — Telephone Encounter (Signed)
She would like something called in for pain

## 2019-02-24 ENCOUNTER — Ambulatory Visit: Payer: BC Managed Care – PPO | Admitting: Sports Medicine

## 2019-03-02 ENCOUNTER — Ambulatory Visit: Payer: BC Managed Care – PPO | Admitting: Sports Medicine

## 2019-04-28 ENCOUNTER — Ambulatory Visit (INDEPENDENT_AMBULATORY_CARE_PROVIDER_SITE_OTHER): Payer: BC Managed Care – PPO | Admitting: Registered Nurse

## 2019-04-28 ENCOUNTER — Encounter: Payer: Self-pay | Admitting: Registered Nurse

## 2019-04-28 ENCOUNTER — Other Ambulatory Visit: Payer: Self-pay

## 2019-04-28 VITALS — BP 164/99 | HR 76 | Temp 98.2°F | Resp 12 | Ht 63.0 in | Wt 280.0 lb

## 2019-04-28 DIAGNOSIS — M797 Fibromyalgia: Secondary | ICD-10-CM

## 2019-04-28 DIAGNOSIS — Z760 Encounter for issue of repeat prescription: Secondary | ICD-10-CM | POA: Diagnosis not present

## 2019-04-28 DIAGNOSIS — R200 Anesthesia of skin: Secondary | ICD-10-CM | POA: Diagnosis not present

## 2019-04-28 MED ORDER — DULOXETINE HCL 60 MG PO CPEP: 60.0000 mg | ORAL_CAPSULE | Freq: Every day | ORAL | 3 refills | Status: DC

## 2019-04-28 MED ORDER — DULOXETINE HCL 30 MG PO CPEP: 30.0000 mg | ORAL_CAPSULE | Freq: Every day | ORAL | 0 refills | Status: DC

## 2019-04-28 MED ORDER — PREDNISONE 20 MG PO TABS: 60.0000 mg | ORAL_TABLET | Freq: Every day | ORAL | 0 refills | Status: AC

## 2019-04-28 NOTE — Patient Instructions (Addendum)
Ms. Lindsay Norman- I believe you have Bell's Palsy, a short term, transient facial paralysis. We will have you take a course of prednisone to help shorten the symptoms. We will also pursue lab work today to help rule out other causes of this numbness. These labs include a CBC, ANA, ESR, CRP, A1c, CMP, and a pathologist smear review.  Please take the prednisone as follows: 26m by mouth each morning for 1 week. There is no need to taper with a short course like this.  If you notice any major changes in symptoms or points of concern, please proceed to the ER. This would include changes like new or different headaches, sudden shortness of breath, acute visual changes, or numbness/weakness in your limbs.    Bell Palsy, Adult  Bell palsy is a short-term inability to move muscles in part of the face. The inability to move (paralysis) results from inflammation or compression of the facial nerve, which travels along the skull and under the ear to the side of the face (7th cranial nerve). This nerve is responsible for facial movements that include blinking, closing the eyes, smiling, and frowning. What are the causes? The exact cause of this condition is not known. It may be caused by an infection from a virus, such as the chickenpox (herpes zoster), Epstein-Barr, or mumps virus. What increases the risk? You are more likely to develop this condition if:  You are pregnant.  You have diabetes.  You have had a recent infection in your nose, throat, or airways (upper respiratory infection).  You have a weakened body defense system (immune system).  You have had a facial injury, such as a fracture.  You have a family history of Bell palsy. What are the signs or symptoms? Symptoms of this condition include:  Weakness on one side of the face.  Drooping eyelid and corner of the mouth.  Excessive tearing in one eye.  Difficulty closing the eyelid.  Dry eye.  Drooling.  Dry mouth.  Changes in  taste.  Change in facial appearance.  Pain behind one ear.  Ringing in one or both ears.  Sensitivity to sound in one ear.  Facial twitching.  Headache.  Impaired speech.  Dizziness.  Difficulty eating or drinking. Most of the time, only one side of the face is affected. Rarely, Bell palsy affects the whole face. How is this diagnosed? This condition is diagnosed based on:  Your symptoms.  Your medical history.  A physical exam. You may also have to see health care providers who specialize in disorders of the nerves (neurologist) or diseases and conditions of the eye (ophthalmologist). You may have tests, such as:  A test to check for nerve damage (electromyogram).  Imaging studies, such as CT or MRI scans.  Blood tests. How is this treated? This condition affects every person differently. Sometimes symptoms go away without treatment within a couple weeks. If treatment is needed, it varies from person to person. The goal of treatment is to reduce inflammation and protect the eye from damage. Treatment for Bell palsy may include:  Medicines, such as: ? Steroids to reduce swelling and inflammation. ? Antiviral drugs. ? Pain relievers, including aspirin, acetaminophen, or ibuprofen.  Eye drops or ointment to keep your eye moist.  Eye protection, if you cannot close your eye.  Exercises or massage to regain muscle strength and function (physical therapy). Follow these instructions at home:   Take over-the-counter and prescription medicines only as told by your health care provider.  If your eye is affected: ? Keep your eye moist with eye drops or ointment as told by your health care provider. ? Follow instructions for eye care and protection as told by your health care provider.  Do any physical therapy exercises as told by your health care provider.  Keep all follow-up visits as told by your health care provider. This is important. Contact a health care  provider if:  You have a fever.  Your symptoms do not get better within 2-3 weeks, or your symptoms get worse.  Your eye is red, irritated, or painful.  You have new symptoms. Get help right away if:  You have weakness or numbness in a part of your body other than your face.  You have trouble swallowing.  You develop neck pain or stiffness.  You develop dizziness or shortness of breath. Summary  Bell palsy is a short-term inability to move muscles in part of the face. The inability to move (paralysis) results from inflammation or compression of the facial nerve.  This condition affects every person differently. Sometimes symptoms go away without treatment within a couple weeks.  If treatment is needed, it varies from person to person. The goal of treatment is to reduce inflammation and protect the eye from damage.  Contact your health care provider if your symptoms do not get better within 2-3 weeks, or your symptoms get worse. This information is not intended to replace advice given to you by your health care provider. Make sure you discuss any questions you have with your health care provider. Document Released: 05/04/2005 Document Revised: 04/16/2017 Document Reviewed: 07/07/2016 Elsevier Patient Education  2020 [employer].

## 2019-04-29 LAB — CMP14+EGFR
ALT: 16 IU/L (ref 0–32)
AST: 15 IU/L (ref 0–40)
Albumin/Globulin Ratio: 1.6 (ref 1.2–2.2)
Albumin: 4.1 g/dL (ref 3.8–4.8)
Alkaline Phosphatase: 123 IU/L — ABNORMAL HIGH (ref 39–117)
BUN/Creatinine Ratio: 11 (ref 9–23)
BUN: 8 mg/dL (ref 6–24)
Bilirubin Total: <0.2 mg/dL (ref 0.0–1.2)
CO2: 23 mmol/L (ref 20–29)
Calcium: 8.9 mg/dL (ref 8.7–10.2)
Chloride: 102 mmol/L (ref 96–106)
Creatinine, Ser: 0.72 mg/dL (ref 0.57–1.00)
GFR calc Af Amer: 119 mL/min/{1.73_m2} (ref >59)
GFR calc non Af Amer: 104 mL/min/{1.73_m2} (ref >59)
Globulin, Total: 2.6 g/dL (ref 1.5–4.5)
Glucose: 74 mg/dL (ref 65–99)
Potassium: 3.9 mmol/L (ref 3.5–5.2)
Sodium: 138 mmol/L (ref 134–144)
Total Protein: 6.7 g/dL (ref 6.0–8.5)

## 2019-04-29 LAB — CBC
Hematocrit: 37.1 % (ref 34.0–46.6)
Hemoglobin: 12.6 g/dL (ref 11.1–15.9)
MCH: 29.6 pg (ref 26.6–33.0)
MCHC: 34 g/dL (ref 31.5–35.7)
MCV: 87 fL (ref 79–97)
Platelets: 309 10*3/uL (ref 150–450)
RBC: 4.26 x10E6/uL (ref 3.77–5.28)
RDW: 12.9 % (ref 11.7–15.4)
WBC: 9.1 10*3/uL (ref 3.4–10.8)

## 2019-04-29 LAB — SEDIMENTATION RATE: Sed Rate: 51 mm/hr — ABNORMAL HIGH (ref 0–32)

## 2019-04-29 LAB — C-REACTIVE PROTEIN: CRP: 19 mg/L — ABNORMAL HIGH (ref 0–10)

## 2019-04-29 LAB — HEMOGLOBIN A1C
Est. average glucose Bld gHb Est-mCnc: 117 mg/dL
Hgb A1c MFr Bld: 5.7 % — ABNORMAL HIGH (ref 4.8–5.6)

## 2019-04-29 LAB — ANA: Anti Nuclear Antibody (ANA): NEGATIVE

## 2019-05-01 NOTE — Progress Notes (Signed)
Established Patient Office Visit  Subjective:  Patient ID: Lindsay Norman, female    DOB: [birth year]  Age: 43 y.o. MRN: [id number]  CC:  Chief Complaint  Patient presents with  . Numbness    Pt stated--numbness bottom of the lip but no pain--    HPI Lindsay Norman presents for L sided face numbess.   Ongoing for a few days. Staying about the same. No associated symptoms. Notes that she has not had this before. No facial asymmetry. BP elevated today but denies CV symptoms. No weakness, numbness, tingling in extremities or face. No GI symptoms. No recent injury, infection, or insect bite  Hoping to restart on cymbalta today. Has been on it in the past for depression. Unfortunately lost insurance for a period of time and had to stop d/t cost.   Past Medical History:  Diagnosis Date  . Fibromyalgia   . Osteogenesis imperfecta   . Osteopenia   . Polycystic ovarian syndrome   . Scoliosis     Past Surgical History:  Procedure Laterality Date  . CESAREAN SECTION    . CHOLECYSTECTOMY    . KNEE SURGERY    . KNEE SURGERY Left     Family History  Problem Relation Age of Onset  . Fibromyalgia Mother   . Hypertension Mother   . Depression Mother   . Cancer Father        colon cancer  . Osteoarthritis Father   . Depression Brother     Social History   Socioeconomic History  . Marital status: Divorced    Spouse name: Not on file  . Number of children: Not on file  . Years of education: Not on file  . Highest education level: Not on file  Occupational History  . Not on file  Tobacco Use  . Smoking status: Current Every Day Smoker    Packs/day: 1.00    Types: Cigarettes  . Smokeless tobacco: Never Used  Substance and Sexual Activity  . Alcohol use: No  . Drug use: No  . Sexual activity: Yes  Other Topics Concern  . Not on file  Social History Narrative  . Not on file   Social Determinants of Health   Financial Resource Strain:   . Difficulty of Paying  Living Expenses: Not on file  Food Insecurity:   . Worried About Charity fundraiser in the Last Year: Not on file  . Ran Out of Food in the Last Year: Not on file  Transportation Needs:   . Lack of Transportation (Medical): Not on file  . Lack of Transportation (Non-Medical): Not on file  Physical Activity:   . Days of Exercise per Week: Not on file  . Minutes of Exercise per Session: Not on file  Stress:   . Feeling of Stress : Not on file  Social Connections:   . Frequency of Communication with Friends and Family: Not on file  . Frequency of Social Gatherings with Friends and Family: Not on file  . Attends Religious Services: Not on file  . Active Member of Clubs or Organizations: Not on file  . Attends Archivist Meetings: Not on file  . Marital Status: Not on file  Intimate Partner Violence:   . Fear of Current or Ex-Partner: Not on file  . Emotionally Abused: Not on file  . Physically Abused: Not on file  . Sexually Abused: Not on file    Outpatient Medications Prior to Visit  Medication Sig Dispense  Refill  . gabapentin (NEURONTIN) 100 MG capsule TAKE 1 CAPSULE IN THE MORNING AND 2 CAPSULES AT BEDTIME. 90 capsule 0  . DULoxetine (CYMBALTA) 60 MG capsule Take 60 mg by mouth daily.     No facility-administered medications prior to visit.    Allergies  Allergen Reactions  . Celebrex [Celecoxib]   . Penicillins     ROS Review of Systems  HENT: Negative.   Eyes: Negative.   Respiratory: Negative.   Cardiovascular: Negative.   Gastrointestinal: Negative.   Endocrine: Negative.   Genitourinary: Negative.   Musculoskeletal: Negative.   Skin: Negative.   Allergic/Immunologic: Negative.   Neurological: Positive for numbness. Negative for facial asymmetry.  Hematological: Negative.   Psychiatric/Behavioral: Positive for dysphoric mood. Negative for self-injury and suicidal ideas. The patient is not nervous/anxious.   All other systems reviewed and are  negative.     Objective:    Physical Exam  Constitutional: She is oriented to person, place, and time. She appears well-developed and well-nourished. No distress.  Cardiovascular: Normal rate, regular rhythm and normal heart sounds.  Pulmonary/Chest: Effort normal and breath sounds normal. No respiratory distress.  Neurological: She is alert and oriented to person, place, and time. No cranial nerve deficit or sensory deficit. She exhibits normal muscle tone. Coordination and gait normal. GCS eye subscore is 4. GCS verbal subscore is 5. GCS motor subscore is 6.  Skin: Skin is warm. No rash noted. She is not diaphoretic. No erythema. No pallor.  Psychiatric: She has a normal mood and affect. Her behavior is normal. Judgment and thought content normal.    BP (!) 164/99 (BP Location: Right Arm, Patient Position: Sitting, Cuff Size: Normal)   Pulse 76   Temp 98.2 F (36.8 C)   Resp 12   Ht '5\' 3"'$  (1.6 m)   Wt 280 lb (127 kg)   SpO2 94%   BMI 49.60 kg/m  Wt Readings from Last 3 Encounters:  04/28/19 280 lb (127 kg)  03/01/17 253 lb (114.8 kg)  09/08/16 247 lb 6.4 oz (112.2 kg)     Health Maintenance Due  Topic Date Due  . HIV Screening  11/23/1991  . PAP SMEAR-Modifier  06/03/2018    There are no preventive care reminders to display for this patient.  Lab Results  Component Value Date   TSH 1.890 09/08/2016   Lab Results  Component Value Date   WBC 9.1 04/28/2019   HGB 12.6 04/28/2019   HCT 37.1 04/28/2019   MCV 87 04/28/2019   PLT 309 04/28/2019   Lab Results  Component Value Date   NA 138 04/28/2019   K 3.9 04/28/2019   CO2 23 04/28/2019   GLUCOSE 74 04/28/2019   BUN 8 04/28/2019   CREATININE 0.72 04/28/2019   BILITOT <0.2 04/28/2019   ALKPHOS 123 (H) 04/28/2019   AST 15 04/28/2019   ALT 16 04/28/2019   PROT 6.7 04/28/2019   ALBUMIN 4.1 04/28/2019   CALCIUM 8.9 04/28/2019   Lab Results  Component Value Date   CHOL 142 06/04/2015   Lab Results    Component Value Date   HDL 38 (L) 06/04/2015   Lab Results  Component Value Date   LDLCALC 71 06/04/2015   Lab Results  Component Value Date   TRIG 164 (H) 06/04/2015   Lab Results  Component Value Date   CHOLHDL 3.7 06/04/2015   Lab Results  Component Value Date   HGBA1C 5.7 (H) 04/28/2019      Assessment &  Plan:   Problem List Items Addressed This Visit      Other   Fibromyalgia - Primary   Relevant Medications   DULoxetine (CYMBALTA) 60 MG capsule   DULoxetine (CYMBALTA) 30 MG capsule   predniSONE (DELTASONE) 20 MG tablet    Other Visit Diagnoses    Medication refill       Left facial numbness       Relevant Medications   predniSONE (DELTASONE) 20 MG tablet   Other Relevant Orders   CBC (Completed)   Sedimentation Rate (Completed)   C-reactive protein (Completed)   ANA (Completed)   Hemoglobin A1c (Completed)   Pathologist smear review (Completed)   CMP14+EGFR (Completed)      Meds ordered this encounter  Medications  . DULoxetine (CYMBALTA) 60 MG capsule    Sig: Take 1 capsule (60 mg total) by mouth daily.    Dispense:  90 capsule    Refill:  3  . DULoxetine (CYMBALTA) 30 MG capsule    Sig: Take 1 capsule (30 mg total) by mouth daily.    Dispense:  7 capsule    Refill:  0    Order Specific Question:   Supervising Provider    Answer:   Delia Chimes A [id number]  . predniSONE (DELTASONE) 20 MG tablet    Sig: Take 3 tablets (60 mg total) by mouth daily with breakfast for 7 days.    Dispense:  21 tablet    Refill:  0    Order Specific Question:   Supervising Provider    Answer:   Forrest Moron [id number]    Follow-up: No follow-ups on file.   PLAN  Appears as Bell's Palsy, unknown etiology. Will give prednisone burst and monitor effect - pt to contact clinic if symptoms worsen or fail to improve  Will restart cymbalta - 17m PO qd for one week, then increase to 623mPO qd. Return in 8-12 weeks for med check  Patient encouraged to call  clinic with any questions, comments, or concerns.   RiMaximiano CossNP

## 2019-05-02 ENCOUNTER — Encounter: Payer: Self-pay | Admitting: Registered Nurse

## 2019-05-02 LAB — PATHOLOGIST SMEAR REVIEW
Basophils Absolute: 0.1 10*3/uL (ref 0.0–0.2)
Basos: 1 %
EOS (ABSOLUTE): 0.1 10*3/uL (ref 0.0–0.4)
Eos: 1 %
Hematocrit: 37.6 % (ref 34.0–46.6)
Hemoglobin: 12.5 g/dL (ref 11.1–15.9)
Immature Grans (Abs): 0 10*3/uL (ref 0.0–0.1)
Immature Granulocytes: 0 %
Lymphocytes Absolute: 2.2 10*3/uL (ref 0.7–3.1)
Lymphs: 24 %
MCH: 28.7 pg (ref 26.6–33.0)
MCHC: 33.2 g/dL (ref 31.5–35.7)
MCV: 86 fL (ref 79–97)
Monocytes Absolute: 0.5 10*3/uL (ref 0.1–0.9)
Monocytes: 5 %
Neutrophils Absolute: 6.5 10*3/uL (ref 1.4–7.0)
Neutrophils: 69 %
Path Rev PLTs: NORMAL
Path Rev RBC: NORMAL
Path Rev WBC: NORMAL
Platelets: 311 10*3/uL (ref 150–450)
RBC: 4.35 x10E6/uL (ref 3.77–5.28)
RDW: 12.8 % (ref 11.7–15.4)
WBC: 9.4 10*3/uL (ref 3.4–10.8)

## 2019-05-06 ENCOUNTER — Encounter: Payer: Self-pay | Admitting: Registered Nurse

## 2019-05-08 ENCOUNTER — Other Ambulatory Visit: Payer: Self-pay | Admitting: Registered Nurse

## 2019-05-08 DIAGNOSIS — R200 Anesthesia of skin: Secondary | ICD-10-CM

## 2019-05-08 NOTE — Progress Notes (Signed)
Pt with ongoing facial numbness after prednisone burst.  Labs did not elicit etiology Largely unremarkable neuro exam in office Will refer to Neuro for further management.  Kathrin Ruddy, NP

## 2019-06-30 ENCOUNTER — Ambulatory Visit (INDEPENDENT_AMBULATORY_CARE_PROVIDER_SITE_OTHER): Payer: BC Managed Care – PPO

## 2019-06-30 ENCOUNTER — Encounter: Payer: Self-pay | Admitting: Sports Medicine

## 2019-06-30 ENCOUNTER — Ambulatory Visit: Payer: BC Managed Care – PPO | Admitting: Sports Medicine

## 2019-06-30 ENCOUNTER — Other Ambulatory Visit: Payer: Self-pay

## 2019-06-30 ENCOUNTER — Other Ambulatory Visit: Payer: Self-pay | Admitting: Sports Medicine

## 2019-06-30 DIAGNOSIS — M25571 Pain in right ankle and joints of right foot: Secondary | ICD-10-CM

## 2019-06-30 DIAGNOSIS — M19071 Primary osteoarthritis, right ankle and foot: Secondary | ICD-10-CM

## 2019-06-30 DIAGNOSIS — M7751 Other enthesopathy of right foot: Secondary | ICD-10-CM

## 2019-06-30 DIAGNOSIS — M779 Enthesopathy, unspecified: Secondary | ICD-10-CM | POA: Diagnosis not present

## 2019-06-30 MED ORDER — PREDNISONE 10 MG (21) PO TBPK: ORAL_TABLET | ORAL | 0 refills | Status: DC

## 2019-06-30 NOTE — Progress Notes (Signed)
Subjective: Lindsay Norman is a 44 y.o. female patient who presents to office for evaluation of right ankle pain reports that it flared up mid January with sharp nagging pain off and on at the ankle reports that the pain is around the entire ankle front and back worse with standing and with first steps and pressure especially at the back of her right ankle at the Achilles insertion.  Patient reports that she has been having trouble with her knee and is not sure if the knee is causing her ankle to her or if the ankle is causing her need to hurt.  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  Patient has been icing resting and elevating taking Tylenol Cymbalta and meloxicam and gabapentin.  Patient denies any recent injury/trip/fall/sprain/any other causative factors.    Patient Active Problem List   Diagnosis Date Noted  . Vitamin D deficiency 09/15/2016  . Hemoptysis 09/08/2016  . Fatigue 09/08/2016  . Cough 09/08/2016  . 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 Fibromyalgia 09/23/2015  . Tobacco abuse 09/23/2015  . Avascular necrosis of bone of hip (HCC) 12/20/2004  . Adiposity 12/21/1998    Current Outpatient Medications on File Prior to Visit  Medication Sig Dispense Refill  . DULoxetine (CYMBALTA) 30 MG capsule Take 1 capsule (30 mg total) by mouth daily. 7 capsule 0  . DULoxetine (CYMBALTA) 60 MG capsule Take 1 capsule (60 mg total) by mouth daily. 90 capsule 3  . gabapentin (NEURONTIN) 100 MG capsule TAKE 1 CAPSULE IN THE MORNING AND 2 CAPSULES AT BEDTIME. 90 capsule 0  . meloxicam (MOBIC) 15 MG tablet Take 15 mg by mouth daily.     No current facility-administered medications on file prior to visit.    Allergies  Allergen Reactions  . Celebrex [Celecoxib]   . Penicillins     Objective:  General: Alert and oriented x3 in no acute distress  Dermatology: No open lesions bilateral lower  extremities, no webspace macerations, no ecchymosis bilateral, all nails x 10 are well manicured.  Vascular: Dorsalis Pedis and Posterior Tibial pedal pulses palpable, Capillary Fill Time 3 seconds,(+) pedal hair growth bilateral, no edema bilateral lower extremities, Temperature gradient within normal limits.  Neurology: Michaell Cowing sensation intact via light touch bilateral.  Musculoskeletal: Mild to moderate tenderness with palpation at right Achilles insertion and lateral ankle and anterior ankle and mild pain on the medial ankle pain is circumferential, negative talar tilt, Negative tib-fib stress, No instability however there is now grinding with range of motion at the right ankle. No pain with calf compression bilateral. Strength within normal limits in all groups bilateral.   Gait: Antalgic gait  Xrays  Right ankle   Impression: Significant joint space narrowing and arthritis noted at the right ankle, unchanged from prior x-ray in September 2020, no fracture or significant dislocation of joint noted.  Mild soft tissue swelling.  No other acute findings.  Assessment and Plan: Problem List Items Addressed This Visit    None    Visit Diagnoses    Capsulitis of ankle, right    -  Primary   Relevant Medications   meloxicam (MOBIC) 15 MG tablet   predniSONE (STERAPRED UNI-PAK 21 TAB) 10 MG (21) TBPK tablet   Arthritis of right ankle       Relevant Medications   meloxicam (MOBIC) 15 MG tablet   predniSONE (STERAPRED UNI-PAK 21 TAB) 10 MG (21)  TBPK tablet   Tendonitis       Acute right ankle pain         -Complete examination performed -Xrays reviewed -Discussed treatement options tendinitis versus capsulitis with underlying arthritis at right ankle -Prescribed prednisone Dosepak to take as instructed for diffuse ankle pain and also history of knee pain -Dispensed night splint for patient to use at bedtime -Dispensed heel lift for patient to use to decrease pressure at ankle -Continue  with rest ice elevation Tylenol gabapentin Cymbalta as prescribed and advised patient once she starts the prednisone to stop the meloxicam for the 6-day course. -Patient to return to office if no better after 2 weeks or sooner if condition worsens.  Landis Martins, DPM

## 2019-07-03 ENCOUNTER — Other Ambulatory Visit: Payer: Self-pay | Admitting: Sports Medicine

## 2019-07-03 DIAGNOSIS — M7751 Other enthesopathy of right foot: Secondary | ICD-10-CM

## 2019-08-16 ENCOUNTER — Telehealth: Payer: Self-pay | Admitting: *Deleted

## 2019-08-16 ENCOUNTER — Encounter: Payer: Self-pay | Admitting: Sports Medicine

## 2019-08-16 ENCOUNTER — Other Ambulatory Visit: Payer: Self-pay

## 2019-08-16 ENCOUNTER — Ambulatory Visit: Payer: BC Managed Care – PPO | Admitting: Sports Medicine

## 2019-08-16 DIAGNOSIS — G8929 Other chronic pain: Secondary | ICD-10-CM

## 2019-08-16 DIAGNOSIS — M19071 Primary osteoarthritis, right ankle and foot: Secondary | ICD-10-CM

## 2019-08-16 DIAGNOSIS — M7751 Other enthesopathy of right foot: Secondary | ICD-10-CM | POA: Diagnosis not present

## 2019-08-16 DIAGNOSIS — M779 Enthesopathy, unspecified: Secondary | ICD-10-CM

## 2019-08-16 DIAGNOSIS — M25571 Pain in right ankle and joints of right foot: Secondary | ICD-10-CM | POA: Diagnosis not present

## 2019-08-16 MED ORDER — PREDNISONE 10 MG (21) PO TBPK: ORAL_TABLET | ORAL | 0 refills | Status: DC

## 2019-08-16 NOTE — Progress Notes (Signed)
Subjective: Phebe D Friesz is a 44 y.o. female patient who returns to office to follow-up on right ankle pain reports that her ankle is staying swollen and the pain is now going up the leg starting to get worse about 2 weeks ago reports that slowly is gotten a little bit better pain 6-7 out of 10 sharp constant in nature worse with the more standing and walking and when she goes from sitting to standing up on the foot has been consistent with taking her meloxicam using her night splint heel lift and taking gabapentin and Cymbalta and occasionally taking over-the-counter arthritis medication.  Patient reports that prednisone in the past has helped but as soon as she finishes the prednisone can the pain comes back.  Patient denies any recent re-injury/trip/fall/sprain/any other causative factors.   No other pedal complaints noted.  Patient Active Problem List   Diagnosis Date Noted  . Vitamin D deficiency 09/15/2016  . Hemoptysis 09/08/2016  . Fatigue 09/08/2016  . Cough 09/08/2016  . 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 Fibromyalgia 09/23/2015  . Tobacco abuse 09/23/2015  . Avascular necrosis of bone of hip ([place]) 12/20/2004  . Adiposity 12/21/1998    Current Outpatient Medications on File Prior to Visit  Medication Sig Dispense Refill  . clarithromycin (BIAXIN) 500 MG tablet Take 500 mg by mouth 2 (two) times daily.    . DULoxetine (CYMBALTA) 30 MG capsule Take 1 capsule (30 mg total) by mouth daily. 7 capsule 0  . DULoxetine (CYMBALTA) 60 MG capsule Take 1 capsule (60 mg total) by mouth daily. 90 capsule 3  . gabapentin (NEURONTIN) 100 MG capsule TAKE 1 CAPSULE IN THE MORNING AND 2 CAPSULES AT BEDTIME. 90 capsule 0  . meloxicam (MOBIC) 15 MG tablet Take 15 mg by mouth daily.     No current facility-administered medications on file prior to visit.    Allergies  Allergen Reactions  . Celebrex [Celecoxib]   . Penicillins     Objective:  General: Alert and oriented x3 in no  acute distress  Dermatology: No open lesions bilateral lower extremities, no webspace macerations, no ecchymosis bilateral, all nails x 10 are well manicured.  Vascular: Dorsalis Pedis and Posterior Tibial pedal pulses palpable, Capillary Fill Time 3 seconds,(+) pedal hair growth bilateral, no edema bilateral lower extremities, Temperature gradient within normal limits.  Neurology: Johney Maine sensation intact via light touch bilateral.  Musculoskeletal: Mild to moderate tenderness with palpation at right Achilles insertion and lateral ankle and anterior ankle and mild pain on the medial ankle pain is circumferential like before with symptoms that radiate up the leg, negative talar tilt, Negative tib-fib stress, No instability however there is now grinding with range of motion at the right ankle. No pain with calf compression bilateral. Strength within normal limits in all groups bilateral.   Gait antalgic  Assessment and Plan: Problem List Items Addressed This Visit    None    Visit Diagnoses    Arthritis of right ankle    -  Primary   Relevant Medications   predniSONE (STERAPRED UNI-PAK 21 TAB) 10 MG (21) TBPK tablet   Other Relevant Orders   Uric acid   Sedimentation rate   C-reactive protein   Rheumatoid factor   ANA, IFA Comprehensive Panel   HLA-B27 antigen   CBC with Differential/Platelet   Capsulitis of ankle, right       Relevant Medications   predniSONE (STERAPRED UNI-PAK 21 TAB) 10 MG (21) TBPK tablet   Tendonitis  Chronic pain of right ankle       Relevant Medications   predniSONE (STERAPRED UNI-PAK 21 TAB) 10 MG (21) TBPK tablet     -Complete examination performed -Previous xrays reviewed -Discussed treatement options tendinitis versus capsulitis with underlying arthritis at right ankle that has failed to make any improvements -Prescribed prednisone Dosepak to take as instructed for diffuse ankle pain -Ordered arthritic panel to evaluate for any underlying causes  of this chronic pain since patient has multiple joint pains -Continue with night splint for patient to use at bedtime as tolerated -Advised patient to return to using her walking boot even when at work work note provided to allow for this -Continue with rest ice elevation Tylenol gabapentin Cymbalta as prescribed and advised patient once she starts the prednisone to stop the meloxicam for the 6-day course or any other NSAIDs like previous -Patient to return to office after MRI or sooner if condition worsens.  Landis Martins, DPM

## 2019-08-16 NOTE — Telephone Encounter (Signed)
-----   Message from Asencion Islam, [place] sent at 08/16/2019 12:10 PM EDT ----- Regarding: MRI right ankle Chronic right ankle pain along the Achilles insertion and the anterior ankle not better with immobilization anti-inflammatories night splint heel lift gabapentin Cymbalta

## 2019-08-17 ENCOUNTER — Telehealth: Payer: Self-pay

## 2019-08-17 NOTE — Telephone Encounter (Signed)
Authorization for MRI of Rt ankle is not required. Referemce number is [id number]

## 2019-08-17 NOTE — Telephone Encounter (Signed)
MRI of Rt ankle w/o contrast does not require authorization  Reference number is [id number]  Pt's appt at [facility] MRI was scheduled for Lindsay Norman 6th, pt to check in at 7:15 PM  for 7:30PM appt time.  I called pt to notify of appt but no response so a voice message was left

## 2019-08-24 ENCOUNTER — Encounter: Payer: Self-pay | Admitting: Sports Medicine

## 2019-08-25 ENCOUNTER — Telehealth: Payer: Self-pay | Admitting: *Deleted

## 2019-08-25 NOTE — Telephone Encounter (Signed)
[facility] request a call for the stat report.

## 2019-08-25 NOTE — Telephone Encounter (Signed)
I spoke with [facility] and asked that the report be faxed to [phone].

## 2019-08-28 LAB — ANA COMPREHENSIVE PANEL
Anti JO-1: <0.2 AI (ref 0.0–0.9)
Centromere Ab Screen: <0.2 AI (ref 0.0–0.9)
Chromatin Ab SerPl-aCnc: <0.2 AI (ref 0.0–0.9)
ENA RNP Ab: <0.2 AI (ref 0.0–0.9)
ENA SM Ab Ser-aCnc: <0.2 AI (ref 0.0–0.9)
ENA SSA (RO) Ab: <0.2 AI (ref 0.0–0.9)
ENA SSB (LA) Ab: <0.2 AI (ref 0.0–0.9)
Scleroderma (Scl-70) (ENA) Antibody, IgG: <0.2 AI (ref 0.0–0.9)
dsDNA Ab: <1 IU/mL (ref 0–9)

## 2019-08-28 LAB — CBC WITH DIFFERENTIAL/PLATELET
Basophils Absolute: 0.1 10*3/uL (ref 0.0–0.2)
Basos: 1 %
EOS (ABSOLUTE): 0.1 10*3/uL (ref 0.0–0.4)
Eos: 0 %
Hematocrit: 38.1 % (ref 34.0–46.6)
Hemoglobin: 12.8 g/dL (ref 11.1–15.9)
Immature Grans (Abs): 0.4 10*3/uL — ABNORMAL HIGH (ref 0.0–0.1)
Immature Granulocytes: 2 %
Lymphocytes Absolute: 5 10*3/uL — ABNORMAL HIGH (ref 0.7–3.1)
Lymphs: 29 %
MCH: 29.7 pg (ref 26.6–33.0)
MCHC: 33.6 g/dL (ref 31.5–35.7)
MCV: 88 fL (ref 79–97)
Monocytes Absolute: 1 10*3/uL — ABNORMAL HIGH (ref 0.1–0.9)
Monocytes: 6 %
Neutrophils Absolute: 10.6 10*3/uL — ABNORMAL HIGH (ref 1.4–7.0)
Neutrophils: 62 %
Platelets: 352 10*3/uL (ref 150–450)
RBC: 4.31 x10E6/uL (ref 3.77–5.28)
RDW: 13.1 % (ref 11.7–15.4)
WBC: 17.1 10*3/uL — ABNORMAL HIGH (ref 3.4–10.8)

## 2019-08-28 LAB — SEDIMENTATION RATE: Sed Rate: 18 mm/hr (ref 0–32)

## 2019-08-28 LAB — URIC ACID: Uric Acid: 3.9 mg/dL (ref 2.6–6.2)

## 2019-08-28 LAB — RHEUMATOID FACTOR: Rheumatoid fact SerPl-aCnc: <10 IU/mL (ref 0.0–13.9)

## 2019-08-28 LAB — HLA-B27 ANTIGEN: HLA B27: POSITIVE

## 2019-08-28 LAB — C-REACTIVE PROTEIN: CRP: 4 mg/L (ref 0–10)

## 2019-08-29 ENCOUNTER — Other Ambulatory Visit: Payer: Self-pay | Admitting: Sports Medicine

## 2019-08-29 MED ORDER — DOXYCYCLINE HYCLATE 100 MG PO TABS: 100.0000 mg | ORAL_TABLET | Freq: Two times a day (BID) | ORAL | 0 refills | Status: DC

## 2019-08-29 NOTE — Progress Notes (Signed)
Sent doxycycline for elevation in white blood cell count on blood work

## 2019-08-31 ENCOUNTER — Telehealth: Payer: Self-pay | Admitting: *Deleted

## 2019-08-31 ENCOUNTER — Encounter: Payer: Self-pay | Admitting: Sports Medicine

## 2019-08-31 ENCOUNTER — Telehealth: Payer: Self-pay | Admitting: Sports Medicine

## 2019-08-31 ENCOUNTER — Telehealth (INDEPENDENT_AMBULATORY_CARE_PROVIDER_SITE_OTHER): Payer: BC Managed Care – PPO | Admitting: Sports Medicine

## 2019-08-31 DIAGNOSIS — M25571 Pain in right ankle and joints of right foot: Secondary | ICD-10-CM

## 2019-08-31 DIAGNOSIS — S82301A Unspecified fracture of lower end of right tibia, initial encounter for closed fracture: Secondary | ICD-10-CM

## 2019-08-31 DIAGNOSIS — M2550 Pain in unspecified joint: Secondary | ICD-10-CM

## 2019-08-31 DIAGNOSIS — M7751 Other enthesopathy of right foot: Secondary | ICD-10-CM

## 2019-08-31 DIAGNOSIS — M19071 Primary osteoarthritis, right ankle and foot: Secondary | ICD-10-CM

## 2019-08-31 DIAGNOSIS — G8929 Other chronic pain: Secondary | ICD-10-CM

## 2019-08-31 DIAGNOSIS — D7289 Other specified disorders of white blood cells: Secondary | ICD-10-CM

## 2019-08-31 NOTE — Telephone Encounter (Signed)
Returned patient's phone call.  Patient expressed to me that she spoke to her job about her foot and ankle pain and at this time they do not have any lighter duty options and she would not be able to wear her cam boot at work but her HR department did provide her with short-term disability paperwork of which I will complete for her to be out of work approximately 2.5 weeks 4-16 to 09-19-19 until she can be seen by the rheumatologist. -Dr. Marylene Land

## 2019-08-31 NOTE — Addendum Note (Signed)
Addended by: Alphia Kava D on: 08/31/2019 01:36 PM   Modules accepted: Orders

## 2019-08-31 NOTE — Telephone Encounter (Signed)
I informed pt of Dr. Wynema Birch review of blood work and MRI with orders to refer to rheumatology, and [facility] was preferred. Pt states understanding. Faxed required form, clinicals and demographics to [facility].

## 2019-08-31 NOTE — Progress Notes (Signed)
Virtual Visit via Telephone Note  I connected with Lindsay Norman on [birth year] at  8:30 AM EDT by telephone and verified that I am speaking with the correct person using two identifiers.  Location: Patient: Lindsay Norman  Provider: Landis Martins, DPM   I discussed the limitations, risks, security and privacy concerns of performing an evaluation and management service by telephone and the availability of in person appointments. I also discussed with the patient that there may be a patient responsible charge related to this service. The patient expressed understanding and agreed to proceed.   History of Present Illness: 44 year old female patient telephone encounter to review MRI and lab work, seen on several occasions in office complaining of right ankle pain pain would do better when she is on steroids and when she is using her cam boot however due to limitations at work she is unable to use cam boot and expresses that she still has pain and swelling that is worse after work and with excessive motion especially when she goes from a seated to a standing position at the right ankle.  Patient reports that she completed the steroid Dosepak that I provided to her on last visit with some improvement for a short period of time but now the pain and swelling has returned.  Patient denies any other pedal complaints at this time except issues with her knees as well as a allergy/ENT issue.   Observations/Objective: Physical exam unable to be performed at this time due to telephone method of visit  MRI Nondisplaced distal tibial fracture with surrounding marrow edema and moderate tibiotalar joint arthritis Achilles insertional tendinosis Middle cord fasciitis with heel pad inflammation and calcaneal spur Mild midfoot and forefoot arthritis  Significant positive lab work White blood cell count 7.1 Neutrophils 10.6 Lymphocytes 5 Monocytes 1  Immature Grans 0.4 HLA-B27 positive  Assessment and  Plan: Problem List Items Addressed This Visit    None    Visit Diagnoses    Arthritis of right ankle    -  Primary   Capsulitis of ankle, right       Nondisplaced fracture of distal end of right tibia       Chronic pain of right ankle       Arthralgia, unspecified joint       Corticosteroid-induced neutrophilia          Patient encounter performed via televisit  -Chart reviewed -Discussed with patient MRI results as above -Discussed with patient relevant blood work as above -Advised patient to pick up doxycycline since she is having issues with neutrophilia which is likely steroid-induced but however to be safe wants to make sure she is not having any problems with her ENT issues that could be causing this elevation as well -Advised patient to use her cam boot and 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 roles that she can do at work that will reduce the stress on her ankle -Advised patient if they will not allow her to wear her cam boot at work like I recommend to help with fracture healing and to reduce pain in ankle joint she should purchase an over-the-counter ankle support/brace -Advised patient to continue with rest ice elevation topical pain creams and rubs and oral anti-inflammatories as tolerated -Referral made to rheumatology for positive HLA-B27 -Advised patient to call office if there are any other questions or concerns  Follow Up Instructions: Return to office if pain fails to improve or worsen   I discussed the assessment  and treatment plan with the patient. The patient was provided an opportunity to ask questions and all were answered. The patient agreed with the plan and demonstrated an understanding of the instructions.   The patient was advised to call back or seek an in-person evaluation if the symptoms worsen or if the condition fails to improve as anticipated.  I provided 21 minutes of non-face-to-face time during this encounter.   Landis Martins, DPM

## 2019-08-31 NOTE — Telephone Encounter (Signed)
-----   Message from Asencion Islam, [place] sent at 08/31/2019  8:54 AM EDT ----- Regarding: Refer to rheumatology + HLAB27 on blood work with history of chronic joint pain especially at right ankle MRI shows moderate arthritis and a nondisplaced fracture of the distal end of the tibia which is likely reactionary due to patient work and stress to the arthritic bone

## 2019-09-07 ENCOUNTER — Encounter (HOSPITAL_BASED_OUTPATIENT_CLINIC_OR_DEPARTMENT_OTHER): Payer: Self-pay | Admitting: *Deleted

## 2019-09-07 ENCOUNTER — Other Ambulatory Visit: Payer: Self-pay

## 2019-09-07 ENCOUNTER — Emergency Department (HOSPITAL_BASED_OUTPATIENT_CLINIC_OR_DEPARTMENT_OTHER)
Admission: EM | Admit: 2019-09-07 | Discharge: 2019-09-07 | Disposition: A | Discharge status: Home / Self Care | Payer: BC Managed Care – PPO | Attending: Emergency Medicine | Admitting: Emergency Medicine

## 2019-09-07 ENCOUNTER — Emergency Department (HOSPITAL_BASED_OUTPATIENT_CLINIC_OR_DEPARTMENT_OTHER): Payer: BC Managed Care – PPO

## 2019-09-07 DIAGNOSIS — Z888 Allergy status to other drugs, medicaments and biological substances status: Secondary | ICD-10-CM | POA: Insufficient documentation

## 2019-09-07 DIAGNOSIS — Z79899 Other long term (current) drug therapy: Secondary | ICD-10-CM | POA: Diagnosis not present

## 2019-09-07 DIAGNOSIS — R519 Headache, unspecified: Secondary | ICD-10-CM | POA: Diagnosis present

## 2019-09-07 DIAGNOSIS — Z880 Allergy status to penicillin: Secondary | ICD-10-CM | POA: Diagnosis not present

## 2019-09-07 DIAGNOSIS — F17210 Nicotine dependence, cigarettes, uncomplicated: Secondary | ICD-10-CM | POA: Diagnosis not present

## 2019-09-07 DIAGNOSIS — G43809 Other migraine, not intractable, without status migrainosus: Secondary | ICD-10-CM | POA: Diagnosis not present

## 2019-09-07 LAB — CBC WITH DIFFERENTIAL/PLATELET
Abs Immature Granulocytes: 0.05 10*3/uL (ref 0.00–0.07)
Basophils Absolute: 0 10*3/uL (ref 0.0–0.1)
Basophils Relative: 1 %
Eosinophils Absolute: 0.1 10*3/uL (ref 0.0–0.5)
Eosinophils Relative: 1 %
HCT: 38.6 % (ref 36.0–46.0)
Hemoglobin: 12.8 g/dL (ref 12.0–15.0)
Immature Granulocytes: 1 %
Lymphocytes Relative: 25 %
Lymphs Abs: 2.2 10*3/uL (ref 0.7–4.0)
MCH: 29.6 pg (ref 26.0–34.0)
MCHC: 33.2 g/dL (ref 30.0–36.0)
MCV: 89.4 fL (ref 80.0–100.0)
Monocytes Absolute: 0.4 10*3/uL (ref 0.1–1.0)
Monocytes Relative: 5 %
Neutro Abs: 5.9 10*3/uL (ref 1.7–7.7)
Neutrophils Relative %: 67 %
Platelets: 280 10*3/uL (ref 150–400)
RBC: 4.32 MIL/uL (ref 3.87–5.11)
RDW: 13.2 % (ref 11.5–15.5)
WBC: 8.7 10*3/uL (ref 4.0–10.5)
nRBC: 0 % (ref 0.0–0.2)

## 2019-09-07 LAB — BASIC METABOLIC PANEL
Anion gap: 9 (ref 5–15)
BUN: 11 mg/dL (ref 6–20)
CO2: 24 mmol/L (ref 22–32)
Calcium: 8.6 mg/dL — ABNORMAL LOW (ref 8.9–10.3)
Chloride: 102 mmol/L (ref 98–111)
Creatinine, Ser: 0.62 mg/dL (ref 0.44–1.00)
GFR calc Af Amer: >60 mL/min (ref >60)
GFR calc non Af Amer: >60 mL/min (ref >60)
Glucose, Bld: 165 mg/dL — ABNORMAL HIGH (ref 70–99)
Potassium: 3.5 mmol/L (ref 3.5–5.1)
Sodium: 135 mmol/L (ref 135–145)

## 2019-09-07 MED ORDER — METOCLOPRAMIDE HCL 5 MG/ML IJ SOLN
10.0000 mg | Freq: Once | INTRAMUSCULAR | Status: AC
  Administered 2019-09-07: 10 mg via INTRAVENOUS
  Filled 2019-09-07: qty 2

## 2019-09-07 MED ORDER — METOCLOPRAMIDE HCL 10 MG PO TABS: 10.0000 mg | ORAL_TABLET | Freq: Four times a day (QID) | ORAL | 0 refills | Status: DC | PRN

## 2019-09-07 MED ORDER — DIPHENHYDRAMINE HCL 50 MG/ML IJ SOLN
50.0000 mg | Freq: Once | INTRAMUSCULAR | Status: AC
  Administered 2019-09-07: 50 mg via INTRAVENOUS
  Filled 2019-09-07: qty 1

## 2019-09-07 MED ORDER — SODIUM CHLORIDE 0.9 % IV BOLUS
1000.0000 mL | Freq: Once | INTRAVENOUS | Status: AC
  Administered 2019-09-07: 1000 mL via INTRAVENOUS

## 2019-09-07 NOTE — ED Notes (Signed)
Pt to CT scan.

## 2019-09-07 NOTE — ED Provider Notes (Signed)
[facility] EMERGENCY DEPARTMENT Provider Note   CSN: [id number] Arrival date & time: 09/07/19  1600     History Chief Complaint  Patient presents with  . Headache    Lindsay Norman is a 44 y.o. female history of fibromyalgia, PCOS, rheumatoid arthritis here presenting with headache.  Patient states that for the last 2 months, she noticed some numbness in the left corner of her mouth.  She went to urgent care initially was diagnosed with Bell's palsy.  She finished a course of steroids and it has helped.  She states that in the last 2 months, she has intermittent numbness in the left corner of her mouth.  She states that since yesterday, she had sudden onset of headache.  She states that she has multiple family members with history of migraines but she has no personal history of migraine.  She states that several days ago she was started on doxycycline by her foot doctor because her white blood cell count was elevated.  Denies any fevers or chills.  Denies any neck pain or vomiting.  The history is provided by the patient.       Past Medical History:  Diagnosis Date  . Fibromyalgia   . Osteogenesis imperfecta   . Osteopenia   . Polycystic ovarian syndrome   . Scoliosis     Patient Active Problem List   Diagnosis Date Noted  . Vitamin D deficiency 09/15/2016  . Hemoptysis 09/08/2016  . Fatigue 09/08/2016  . Cough 09/08/2016  . 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 Fibromyalgia 09/23/2015  . Tobacco abuse 09/23/2015  . Avascular necrosis of bone of hip (HCC) 12/20/2004  . Adiposity 12/21/1998    Past Surgical History:  Procedure Laterality Date  . CESAREAN SECTION    . CHOLECYSTECTOMY    . KNEE SURGERY    . KNEE SURGERY Left      OB History   No obstetric history on file.     Family History  Problem Relation Age of Onset  . Fibromyalgia Mother   . Hypertension Mother   . Depression Mother   . Cancer Father        colon cancer  .  Osteoarthritis Father   . Depression Brother     Social History   Tobacco Use  . Smoking status: Current Every Day Smoker    Packs/day: 0.50    Types: Cigarettes  . Smokeless tobacco: Never Used  Substance Use Topics  . Alcohol use: No  . Drug use: No    Home Medications Prior to Admission medications   Medication Sig Start Date End Date Taking? Authorizing Provider  clarithromycin (BIAXIN) 500 MG tablet Take 500 mg by mouth 2 (two) times daily. 07/16/19   [provider]  doxycycline (VIBRA-TABS) 100 MG tablet Take 1 tablet (100 mg total) by mouth 2 (two) times daily. 08/29/19   Asencion Islam, DPM  DULoxetine (CYMBALTA) 30 MG capsule Take 1 capsule (30 mg total) by mouth daily. 04/28/19   Janeece Agee, NP  DULoxetine (CYMBALTA) 60 MG capsule Take 1 capsule (60 mg total) by mouth daily. 04/28/19   Janeece Agee, NP  gabapentin (NEURONTIN) 100 MG capsule TAKE 1 CAPSULE IN THE MORNING AND 2 CAPSULES AT BEDTIME. 09/18/16   Chitanand, Alicia B, DO  meloxicam (MOBIC) 15 MG tablet Take 15 mg by mouth daily. 06/13/19   [provider]  predniSONE (STERAPRED UNI-PAK 21 TAB) 10 MG (21) TBPK tablet Take as directed 08/16/19   Asencion Islam,  DPM    Allergies    Celebrex [celecoxib] and Penicillins  Review of Systems   Review of Systems  Neurological: Positive for numbness and headaches.  All other systems reviewed and are negative.   Physical Exam Updated Vital Signs BP (!) 161/83 (BP Location: Right Arm)   Pulse 91   Temp 98.5 F (36.9 C) (Oral)   Resp 18   Ht 5' 3.5" (1.613 m)   Wt 132.9 kg   LMP 09/03/2019   SpO2 99%   BMI 51.09 kg/m   Physical Exam Vitals and nursing note reviewed.  Constitutional:      Comments: Slightly uncomfortable   HENT:     Head: Normocephalic.     Mouth/Throat:     Mouth: Mucous membranes are moist.  Eyes:     Extraocular Movements: Extraocular movements intact.  Cardiovascular:     Rate and Rhythm: Normal rate and  regular rhythm.  Pulmonary:     Effort: Pulmonary effort is normal.     Breath sounds: Normal breath sounds.  Abdominal:     General: Bowel sounds are normal.     Palpations: Abdomen is soft.  Musculoskeletal:        General: Normal range of motion.     Cervical back: Normal range of motion and neck supple.  Skin:    General: Skin is warm.  Neurological:     Mental Status: She is alert and oriented to person, place, and time.     Cranial Nerves: No cranial nerve deficit.     Comments: Questionable decrease sensation corner of left mouth with no obvious facial droop.  Cranial nerves otherwise unremarkable.  Normal strength and sensation bilateral arms and legs.  Psychiatric:        Mood and Affect: Mood normal.        Behavior: Behavior normal.     ED Results / Procedures / Treatments   Labs (all labs ordered are listed, but only abnormal results are displayed) Labs Reviewed  BASIC METABOLIC PANEL - Abnormal; Notable for the following components:      Result Value   Glucose, Bld 165 (*)    Calcium 8.6 (*)    All other components within normal limits  CBC WITH DIFFERENTIAL/PLATELET    EKG None  Radiology CT Head Wo Contrast  Result Date: 09/07/2019 CLINICAL DATA:  Headache for 2 days. EXAM: CT HEAD WITHOUT CONTRAST TECHNIQUE: Contiguous axial images were obtained from the base of the skull through the vertex without intravenous contrast. COMPARISON:  03/05/2010 FINDINGS: Brain: No evidence of acute infarction, hemorrhage, hydrocephalus, extra-axial collection or mass lesion/mass effect. Vascular: No hyperdense vessel or unexpected calcification. Skull: Normal. Negative for fracture or focal lesion. Sinuses/Orbits: Normal globes and orbits.  Clear sinuses. Other: None. IMPRESSION: Normal enhanced CT scan of the brain. Electronically Signed   By: Amie Portland M.D.   On: 09/07/2019 17:25    Procedures Procedures (including critical care time)  Medications Ordered in  ED Medications  metoCLOPramide (REGLAN) injection 10 mg (10 mg Intravenous Given 09/07/19 1625)  diphenhydrAMINE (BENADRYL) injection 50 mg (50 mg Intravenous Given 09/07/19 1624)  sodium chloride 0.9 % bolus 1,000 mL (1,000 mLs Intravenous New Bag/Given 09/07/19 1623)    ED Course  I have reviewed the triage vital signs and the nursing notes.  Pertinent labs & imaging results that were available during my care of the patient were reviewed by me and considered in my medical decision making (see chart for details).  MDM Rules/Calculators/A&P                     Lindsay Norman is a 44 y.o. female who presented with numbness and headache. Numbness of the corner of the left mouth has been going on for the last 2 months.  She was thought to have Bell's palsy but has no facial droop right now.  Headache is new since yesterday.  I think she likely has complex migraines.  Low suspicion for subarachnoid hemorrhage so if CT head is normal, will not need LP. Will get labs, CT head. Will give migraine cocktail and reassess  6:02 PM CT head unremarkable. Felt better after migraine cocktail. Will dc home     Final Clinical Impression(s) / ED Diagnoses Final diagnoses:  None    Rx / DC Orders ED Discharge Orders    None       Drenda Freeze, MD 09/07/19 [phone]

## 2019-09-07 NOTE — ED Triage Notes (Signed)
Pt c/o h/a x 2 days 

## 2019-09-07 NOTE — Discharge Instructions (Signed)
Take motrin, tylenol for headache  Your labs and CT scan today are unremarkable   See neurologist if you have persistent headache and numbness   Return to ER if you have worse headaches, vomiting

## 2019-09-11 ENCOUNTER — Encounter: Payer: Self-pay | Admitting: Sports Medicine

## 2019-09-15 ENCOUNTER — Encounter: Payer: Self-pay | Admitting: Sports Medicine

## 2019-11-10 ENCOUNTER — Other Ambulatory Visit: Payer: Self-pay

## 2019-11-10 ENCOUNTER — Ambulatory Visit (INDEPENDENT_AMBULATORY_CARE_PROVIDER_SITE_OTHER): Payer: BC Managed Care – PPO | Admitting: Family Medicine

## 2019-11-10 ENCOUNTER — Encounter: Payer: Self-pay | Admitting: Family Medicine

## 2019-11-10 VITALS — BP 139/82 | HR 86 | Temp 97.9°F | Resp 16 | Ht 63.5 in | Wt 301.4 lb

## 2019-11-10 DIAGNOSIS — R350 Frequency of micturition: Secondary | ICD-10-CM

## 2019-11-10 DIAGNOSIS — M158 Other polyosteoarthritis: Secondary | ICD-10-CM

## 2019-11-10 DIAGNOSIS — R3915 Urgency of urination: Secondary | ICD-10-CM

## 2019-11-10 DIAGNOSIS — Z760 Encounter for issue of repeat prescription: Secondary | ICD-10-CM

## 2019-11-10 DIAGNOSIS — N3281 Overactive bladder: Secondary | ICD-10-CM

## 2019-11-10 LAB — POCT URINALYSIS DIP (MANUAL ENTRY)
Bilirubin, UA: NEGATIVE
Glucose, UA: NEGATIVE mg/dL
Ketones, POC UA: NEGATIVE mg/dL
Leukocytes, UA: NEGATIVE
Nitrite, UA: NEGATIVE
Protein Ur, POC: NEGATIVE mg/dL
Spec Grav, UA: >=1.03 — AB (ref 1.010–1.025)
Urobilinogen, UA: 0.2 E.U./dL
pH, UA: 6.5 (ref 5.0–8.0)

## 2019-11-10 LAB — GLUCOSE, POCT (MANUAL RESULT ENTRY): POC Glucose: 106 mg/dl — AB (ref 70–99)

## 2019-11-10 MED ORDER — MELOXICAM 15 MG PO TABS: 15.0000 mg | ORAL_TABLET | Freq: Every day | ORAL | 1 refills | Status: DC

## 2019-11-10 MED ORDER — OXYBUTYNIN CHLORIDE ER 5 MG PO TB24: ORAL_TABLET | ORAL | 1 refills | Status: DC

## 2019-11-10 MED ORDER — GABAPENTIN 100 MG PO CAPS: ORAL_CAPSULE | ORAL | 1 refills | Status: DC

## 2019-11-10 NOTE — Progress Notes (Signed)
Patient ID: Lindsay Norman, female    DOB: May 14, 1976  Age: 44 y.o. MRN: [id number]  Chief Complaint  Patient presents with  . Back Pain    pt has had back pain since wed. and has now started to hurt into her ribs and moving around front, also notes her Lt leg has started sweeling though her Rt leg is typically the leg that swells pt has also noticed urinary frequency increasing over the past 3 weeks, notes sometimes accompained by urgency.   . Referral    pt needs referral to GYN for pap     Subjective:   Patient has been having problems over the last couple of weeks with back pain in the left flank.  It gradually moved around to the side a little bit.  Is not hurting much today.  She then started developing urinary frequency.  She has urgency.  She sometimes does not make it and wets her self.  Last year she had right knee surgery.  Since then she has had more problems with pain in both the right and left knee.  The she has trouble getting around to getting exercise.  She has put on weight which she realizes is detrimental to her.  She works at [employer].  Is up and down all day.  Current allergies, medications, problem list, past/family and social histories reviewed.  Objective:  BP 139/82   Pulse 86   Temp 97.9 F (36.6 C) (Temporal)   Resp 16   Ht 5' 3.5" (1.613 m)   Wt (!) 301 lb 6.4 oz (136.7 kg)   SpO2 97%   BMI 52.55 kg/m   Pleasant lady, alert and oriented.  She is obese.  No CVA tenderness.  Abdomen soft without mass or tenderness.  Has a Band-Aid on the left lower abdominal wall where she has a little sore.  Assessment & Plan:   Assessment: 1. Urinary frequency   2. Urinary urgency   3. Obesity, morbid (HCC)   4. Other osteoarthritis involving multiple joints   5. Overactive bladder   6. Medication refill       Plan: See instructions  Orders Placed This Encounter  Procedures  . POCT urinalysis dipstick  . POCT glucose (manual entry)    Meds ordered  this encounter  Medications  . gabapentin (NEURONTIN) 100 MG capsule    Sig: TAKE 1 CAPSULE IN THE MORNING AND 2 CAPSULES AT BEDTIME.    Dispense:  90 capsule    Refill:  1    Follow up prior to needing further refills 08/2016  . meloxicam (MOBIC) 15 MG tablet    Sig: Take 1 tablet (15 mg total) by mouth daily.    Dispense:  90 tablet    Refill:  1         Patient Instructions       If you have lab work done today you will be contacted with your lab results within the next 2 weeks.  If you have not heard from [place] then please contact us. The fastest way to get your results is to register for My Chart.   IF you received an x-ray today, you will receive an invoice from [facility]. Please contact [facility] Radiology at [phone] with questions or concerns regarding your invoice.   IF you received labwork today, you will receive an invoice from [facility]. Please contact [facility] at [phone] with questions or concerns regarding your invoice.   Our billing staff will not be able  to assist you with questions regarding bills from these companies.  You will be contacted with the lab results as soon as they are available. The fastest way to get your results is to activate your My Chart account. Instructions are located on the last page of this paperwork. If you have not heard from [place] regarding the results in 2 weeks, please contact this office.        No follow-ups on file.   Ruben Reason, MD 11/10/2019

## 2019-11-10 NOTE — Patient Instructions (Addendum)
Continue your regular home medications.  Take Ditropan 5 mg 1 twice daily.  It may cause some dry mouth side effects.  Work hard on the weight.  If you keep having a lot of urinary symptoms we might have to refer you to a urologist.  I refilled the gabapentin and meloxicam per your request.  When on the meloxicam you should not take any Advil, Aleve, or other prescription or nonprescription NSAID medications.  Take with your pharmacist if you have any question.   If you have lab work done today you will be contacted with your lab results within the next 2 weeks.  If you have not heard from [place] then please contact us. The fastest way to get your results is to register for My Chart.   IF you received an x-ray today, you will receive an invoice from [facility]. Please contact [facility] at [phone] with questions or concerns regarding your invoice.   IF you received labwork today, you will receive an invoice from [facility]. Please contact [facility] at [phone] with questions or concerns regarding your invoice.   Our billing staff will not be able to assist you with questions regarding bills from these companies.  You will be contacted with the lab results as soon as they are available. The fastest way to get your results is to activate your My Chart account. Instructions are located on the last page of this paperwork. If you have not heard from [place] regarding the results in 2 weeks, please contact this office.

## 2019-12-04 ENCOUNTER — Other Ambulatory Visit: Payer: Self-pay | Admitting: Family Medicine

## 2019-12-04 DIAGNOSIS — R350 Frequency of micturition: Secondary | ICD-10-CM

## 2019-12-04 DIAGNOSIS — R3915 Urgency of urination: Secondary | ICD-10-CM

## 2020-01-28 ENCOUNTER — Other Ambulatory Visit: Payer: Self-pay | Admitting: Family Medicine

## 2020-01-28 DIAGNOSIS — R350 Frequency of micturition: Secondary | ICD-10-CM

## 2020-01-28 DIAGNOSIS — R3915 Urgency of urination: Secondary | ICD-10-CM

## 2020-01-28 NOTE — Telephone Encounter (Signed)
Requested Prescriptions  Pending Prescriptions Disp Refills  . oxybutynin (DITROPAN-XL) 5 MG 24 hr tablet [Pharmacy Med Name: OXYBUTYNIN CL ER 5 MG TABLET] 180 tablet 1    Sig: TAKE 1 TWICE DAILY TO DECREASE URINARY FREQUENCY.     Urology:  Bladder Agents Passed - 01/28/2020 12:31 PM      Passed - Valid encounter within last 12 months    Recent Outpatient Visits          2 months ago Urinary frequency   [facility] at [facility], Sandria Bales, MD   9 months ago Fibromyalgia   [facility], Gerlene Burdock, NP   2 years ago Body aches   [facility], Marolyn Hammock, PA-C   3 years ago Cough   [facility] at [facility], [place] B, DO   3 years ago Nasal congestion   [facility], Marolyn Hammock, PA-C      Future Appointments            In 2 weeks Myles Lipps, MD Primary [facility] at [place], [facility]           '

## 2020-02-13 ENCOUNTER — Telehealth: Payer: Self-pay | Admitting: Family Medicine

## 2020-02-13 NOTE — Telephone Encounter (Addendum)
02/13/2020 - PATIENT HAS A TRANSFER OF CARE APPOINTMENT SCHEDULED WITH DR. Darcel Bayley ON Friday (02/16/2020) AT 1:20 pm. I TRIED TO CALL AND RESCHEDULE HER WITH ANOTHER PROVIDER DUE TO DR. IRMA LEAVING OUR PRACTICE BUT I HAD TO LEAVE HER A VOICE MAIL TO RETURN MY CALL. MBC  02/15/2020 - MARY TRIED AGAIN TO CALL PATIENT REGARDING DR. Darcel Bayley BUT I HAD TO LEAVE HER ANOTHER VOICE MAIL. MBC

## 2020-02-16 ENCOUNTER — Other Ambulatory Visit: Payer: Self-pay

## 2020-02-16 ENCOUNTER — Ambulatory Visit (INDEPENDENT_AMBULATORY_CARE_PROVIDER_SITE_OTHER): Payer: BC Managed Care – PPO | Admitting: Family Medicine

## 2020-02-16 ENCOUNTER — Ambulatory Visit (INDEPENDENT_AMBULATORY_CARE_PROVIDER_SITE_OTHER): Payer: BC Managed Care – PPO

## 2020-02-16 ENCOUNTER — Encounter: Payer: Self-pay | Admitting: Family Medicine

## 2020-02-16 VITALS — BP 172/88 | HR 104 | Temp 97.8°F | Ht 64.0 in | Wt 308.9 lb

## 2020-02-16 DIAGNOSIS — R0600 Dyspnea, unspecified: Secondary | ICD-10-CM

## 2020-02-16 DIAGNOSIS — R030 Elevated blood-pressure reading, without diagnosis of hypertension: Secondary | ICD-10-CM

## 2020-02-16 DIAGNOSIS — R600 Localized edema: Secondary | ICD-10-CM

## 2020-02-16 DIAGNOSIS — R3121 Asymptomatic microscopic hematuria: Secondary | ICD-10-CM

## 2020-02-16 DIAGNOSIS — R0609 Other forms of dyspnea: Secondary | ICD-10-CM

## 2020-02-16 LAB — POCT URINALYSIS DIP (MANUAL ENTRY)
Bilirubin, UA: NEGATIVE
Glucose, UA: NEGATIVE mg/dL
Ketones, POC UA: NEGATIVE mg/dL
Leukocytes, UA: NEGATIVE
Nitrite, UA: NEGATIVE
Spec Grav, UA: >=1.03 — AB (ref 1.010–1.025)
Urobilinogen, UA: 0.2 E.U./dL
pH, UA: 6 (ref 5.0–8.0)

## 2020-02-16 MED ORDER — TRIAMTERENE-HCTZ 37.5-25 MG PO TABS: 1.0000 | ORAL_TABLET | Freq: Every day | ORAL | 0 refills | Status: DC

## 2020-02-16 NOTE — Patient Instructions (Addendum)
If you have lab work done today you will be contacted with your lab results within the next 2 weeks.  If you have not heard from [place] then please contact us. The fastest way to get your results is to register for My Chart.   IF you received an x-ray today, you will receive an invoice from [facility] Radiology. Please contact [facility]iology at [phone] with questions or concerns regarding your invoice.   IF you received labwork today, you will receive an invoice from [facility]. Please contact [facility] at [phone] with questions or concerns regarding your invoice.   Our billing staff will not be able to assist you with questions regarding bills from these companies.  You will be contacted with the lab results as soon as they are available. The fastest way to get your results is to activate your My Chart account. Instructions are located on the last page of this paperwork. If you have not heard from [place] regarding the results in 2 weeks, please contact this office.      Peripheral Edema  Peripheral edema is swelling that is caused by a buildup of fluid. Peripheral edema most often affects the lower legs, ankles, and feet. It can also develop in the arms, hands, and face. The area of the body that has peripheral edema will look swollen. It may also feel heavy or warm. Your clothes may start to feel tight. Pressing on the area may make a temporary dent in your skin. You may not be able to move your swollen arm or leg as much as usual. There are many causes of peripheral edema. It can happen because of a complication of other conditions such as congestive heart failure, kidney disease, or a problem with your blood circulation. It also can be a side effect of certain medicines or because of an infection. It often happens to women during pregnancy. Sometimes, the cause is not known. Follow these instructions at home: Managing pain, stiffness, and swelling   Raise (elevate) your legs  while you are sitting or lying down.  Move around often to prevent stiffness and to lessen swelling.  Do not sit or stand for long periods of time.  Wear support stockings as told by your health care provider. Medicines  Take over-the-counter and prescription medicines only as told by your health care provider.  Your health care provider may prescribe medicine to help your body get rid of excess water (diuretic). General instructions  Pay attention to any changes in your symptoms.  Follow instructions from your health care provider about limiting salt (sodium) in your diet. Sometimes, eating less salt may reduce swelling.  Moisturize skin daily to help prevent skin from cracking and draining.  Keep all follow-up visits as told by your health care provider. This is important. Contact a health care provider if you have:  A fever.  Edema that starts suddenly or is getting worse, especially if you are pregnant or have a medical condition.  Swelling in only one leg.  Increased swelling, redness, or pain in one or both of your legs.  Drainage or sores at the area where you have edema. Get help right away if you:  Develop shortness of breath, especially when you are lying down.  Have pain in your chest or abdomen.  Feel weak.  Feel faint. Summary  Peripheral edema is swelling that is caused by a buildup of fluid. Peripheral edema most often affects the lower legs, ankles, and feet.  Move  around often to prevent stiffness and to lessen swelling. Do not sit or stand for long periods of time.  Pay attention to any changes in your symptoms.  Contact a health care provider if you have edema that starts suddenly or is getting worse, especially if you are pregnant or have a medical condition.  Get help right away if you develop shortness of breath, especially when lying down. This information is not intended to replace advice given to you by your health care provider. Make sure  you discuss any questions you have with your health care provider. Document Revised: 01/26/2018 Document Reviewed: 01/26/2018 Elsevier Patient Education  2020 [employer].

## 2020-02-16 NOTE — Progress Notes (Signed)
10/1/20211:28 PM  Lindsay Norman November 10, 1975, 44 y.o., female [id number]  Chief Complaint  Patient presents with  . Leg Swelling    both legs and started end of last week     HPI:   Patient is a 44 y.o. female who presents today for bilateral leg edema for past week  She has worked for past 21 days Bilateral edema getting worse She has a known right hip bone spur so thought it might be related but then her left leg started to swollen She also feels that her hands are tight Since July she has been working on drinking only water, has been cutting back chips, snacks She has been having left flank pain 3 days ago Denies any cough, has had about a month of mild DOE No chest pain or palpitations, no n/v, no abd pain, feels bloated/swollen Has urge incontinence - given oxybutynin 5mg  in June 2021, not helping She reports BP and HR go up whenever she is in pain She does snore She denies any new medications Patient reports mild rash with celebrex many years ago, does well with other nsaids  Depression screen [facility] 2/9 02/16/2020 11/10/2019 03/01/2017  Decreased Interest 0 0 0  Down, Depressed, Hopeless 0 0 0  PHQ - 2 Score 0 0 0  Altered sleeping - - -  Tired, decreased energy - - -  Change in appetite - - -  Feeling bad or failure about yourself  - - -  Trouble concentrating - - -  Moving slowly or fidgety/restless - - -  Suicidal thoughts - - -  PHQ-9 Score - - -  Difficult doing work/chores - - -    Fall Risk  02/16/2020 11/10/2019 11/10/2019 03/01/2017 09/08/2016  Falls in the past year? 0 0 0 No No  Number falls in past yr: 0 - - - -  Injury with Fall? 0 - - - -  Follow up Falls evaluation completed Falls evaluation completed Falls evaluation completed - -     Allergies  Allergen Reactions  . Celebrex [Celecoxib]   . Penicillins     Prior to Admission medications   Medication Sig Start Date End Date Taking? Authorizing Provider  DULoxetine (CYMBALTA) 60 MG capsule  Take 1 capsule (60 mg total) by mouth daily. 04/28/19  Yes 14/11/20, NP  gabapentin (NEURONTIN) 100 MG capsule TAKE 1 CAPSULE IN THE MORNING AND 2 CAPSULES AT BEDTIME. 11/10/19  Yes 11/12/19, MD  meloxicam (MOBIC) 15 MG tablet Take 1 tablet (15 mg total) by mouth daily. 11/10/19  Yes 11/12/19, MD  oxybutynin (DITROPAN-XL) 5 MG 24 hr tablet TAKE 1 TWICE DAILY TO DECREASE URINARY FREQUENCY. 01/28/20  Yes 03/29/20, MD  DULoxetine (CYMBALTA) 30 MG capsule Take 1 capsule (30 mg total) by mouth daily. Patient not taking: Reported on 02/16/2020 04/28/19   14/11/20, NP    Past Medical History:  Diagnosis Date  . Fibromyalgia   . Osteogenesis imperfecta   . Osteopenia   . Polycystic ovarian syndrome   . Scoliosis     Past Surgical History:  Procedure Laterality Date  . CESAREAN SECTION    . CHOLECYSTECTOMY    . KNEE SURGERY    . KNEE SURGERY Left     Social History   Tobacco Use  . Smoking status: Current Every Day Smoker    Packs/day: 0.50    Types: Cigarettes  . Smokeless tobacco: Never Used  Substance Use Topics  .  Alcohol use: No    Family History  Problem Relation Age of Onset  . Fibromyalgia Mother   . Hypertension Mother   . Depression Mother   . Cancer Father        colon cancer  . Osteoarthritis Father   . Depression Brother     ROS Per hpi  OBJECTIVE:  Today's Vitals   02/16/20 1322 02/16/20 1330  BP: (!) 180/135 (!) 178/90  Pulse: (!) 104   Temp: 97.8 F (36.6 C)   TempSrc: Temporal   SpO2: 94%   Weight: (!) 308 lb 14.4 oz (140.1 kg)   Height: 5\' 4"  (1.626 m)    Body mass index is 53.02 kg/m.   LMP 01/21/2020  Wt Readings from Last 3 Encounters:  02/16/20 (!) 308 lb 14.4 oz (140.1 kg)  11/10/19 (!) 301 lb 6.4 oz (136.7 kg)  09/07/19 293 lb (132.9 kg)   BP Readings from Last 3 Encounters:  02/16/20 (!) 180/135  11/10/19 139/82  09/07/19 (!) 151/80     Physical Exam Vitals and nursing note reviewed.    Constitutional:      Appearance: She is well-developed.  HENT:     Head: Normocephalic and atraumatic.     Mouth/Throat:     Pharynx: No oropharyngeal exudate.  Eyes:     General: No scleral icterus.    Extraocular Movements: Extraocular movements intact.     Conjunctiva/sclera: Conjunctivae normal.     Pupils: Pupils are equal, round, and reactive to light.  Cardiovascular:     Rate and Rhythm: Normal rate and regular rhythm.     Heart sounds: Normal heart sounds. No murmur heard.  No friction rub. No gallop.   Pulmonary:     Effort: Pulmonary effort is normal.     Breath sounds: Normal breath sounds. No wheezing, rhonchi or rales.  Abdominal:     General: Bowel sounds are normal. There is no distension.     Palpations: Abdomen is soft. There is no hepatomegaly, splenomegaly or mass.     Tenderness: There is no abdominal tenderness. There is no right CVA tenderness or left CVA tenderness.  Musculoskeletal:     Cervical back: Neck supple.     Right lower leg: Edema (BLE +2 pititng edema up to knees) present.     Left lower leg: Edema present.  Skin:    General: Skin is warm and dry.  Neurological:     Mental Status: She is alert and oriented to person, place, and time.     Results for orders placed or performed in visit on 02/16/20 (from the past 24 hour(s))  POCT urinalysis dipstick     Status: Abnormal   Collection Time: 02/16/20  2:29 PM  Result Value Ref Range   Color, UA yellow yellow   Clarity, UA turbid (A) clear   Glucose, UA negative negative mg/dL   Bilirubin, UA negative negative   Ketones, POC UA negative negative mg/dL   Spec Grav, UA 04/17/20 (A) 1.010 - 1.025   Blood, UA small (A) negative   pH, UA 6.0 5.0 - 8.0   Protein Ur, POC trace (A) negative mg/dL   Urobilinogen, UA 0.2 0.2 or 1.0 E.U./dL   Nitrite, UA Negative Negative   Leukocytes, UA Negative Negative    DG Chest 2 View  Result Date: 02/16/2020 CLINICAL DATA:  Dyspnea on exertion and leg  swelling for several weeks. EXAM: CHEST - 2 VIEW COMPARISON:  03/01/2017 FINDINGS: The cardiac silhouette, mediastinal and  hilar contours are within normal limits and stable. The lungs are clear of an acute process. No pleural effusions or pulmonary edema. No pulmonary lesions. The bony thorax is intact. IMPRESSION: No acute cardiopulmonary findings. Electronically Signed   By: Rudie Meyer M.D.   On: 02/16/2020 14:20    My interpretation of EKG:  NSR, HR 81, no acute changes  ASSESSMENT and PLAN  1. Bilateral lower extremity edema Discussed weight loss, compression stockings, low salt diet, walking. Labs pending - Comprehensive metabolic panel - POCT urinalysis dipstick - CBC - TSH - DG Chest 2 View  2. DOE (dyspnea on exertion) Discussed weight loss, labs pending, RTC precautions given. Consider echo/cards referral. - Brain natriuretic peptide - DG Chest 2 View - EKG 12-Lead  3. Elevated BP without diagnosis of hypertension Starting HCTZ for edema and BP Patient reports 2/2 pain, otherwise normal Discussed home monitoring Re-eval at next OV  4. Asymptomatic microscopic hematuria Patient expecting menses soon - Urine Microscopic  Other orders - triamterene-hydrochlorothiazide (MAXZIDE-25) 37.5-25 MG tablet; Take 1 tablet by mouth daily.  Return in about 1 week (around 02/23/2020).    Myles Lipps, MD Primary Care at [facility] [address] [place], [place] [address] Ph.  [phone] Fax [phone]

## 2020-02-17 LAB — BRAIN NATRIURETIC PEPTIDE: BNP: 26.1 pg/mL (ref 0.0–100.0)

## 2020-02-17 LAB — URINALYSIS, MICROSCOPIC ONLY
Casts: NONE SEEN /lpf
Epithelial Cells (non renal): >10 /hpf — AB (ref 0–10)

## 2020-02-17 LAB — COMPREHENSIVE METABOLIC PANEL
ALT: 18 IU/L (ref 0–32)
AST: 18 IU/L (ref 0–40)
Albumin/Globulin Ratio: 1.9 (ref 1.2–2.2)
Albumin: 4.1 g/dL (ref 3.8–4.8)
Alkaline Phosphatase: 114 IU/L (ref 44–121)
BUN/Creatinine Ratio: 21 (ref 9–23)
BUN: 14 mg/dL (ref 6–24)
Bilirubin Total: <0.2 mg/dL (ref 0.0–1.2)
CO2: 24 mmol/L (ref 20–29)
Calcium: 8.8 mg/dL (ref 8.7–10.2)
Chloride: 103 mmol/L (ref 96–106)
Creatinine, Ser: 0.66 mg/dL (ref 0.57–1.00)
GFR calc Af Amer: 125 mL/min/{1.73_m2} (ref >59)
GFR calc non Af Amer: 109 mL/min/{1.73_m2} (ref >59)
Globulin, Total: 2.2 g/dL (ref 1.5–4.5)
Glucose: 114 mg/dL — ABNORMAL HIGH (ref 65–99)
Potassium: 4.1 mmol/L (ref 3.5–5.2)
Sodium: 139 mmol/L (ref 134–144)
Total Protein: 6.3 g/dL (ref 6.0–8.5)

## 2020-02-17 LAB — CBC
Hematocrit: 38.2 % (ref 34.0–46.6)
Hemoglobin: 12.4 g/dL (ref 11.1–15.9)
MCH: 28.9 pg (ref 26.6–33.0)
MCHC: 32.5 g/dL (ref 31.5–35.7)
MCV: 89 fL (ref 79–97)
Platelets: 271 10*3/uL (ref 150–450)
RBC: 4.29 x10E6/uL (ref 3.77–5.28)
RDW: 13.1 % (ref 11.7–15.4)
WBC: 8.6 10*3/uL (ref 3.4–10.8)

## 2020-02-17 LAB — TSH: TSH: 2.29 u[IU]/mL (ref 0.450–4.500)

## 2020-02-20 ENCOUNTER — Other Ambulatory Visit: Payer: Self-pay

## 2020-02-20 ENCOUNTER — Encounter: Payer: Self-pay | Admitting: Family Medicine

## 2020-02-20 ENCOUNTER — Ambulatory Visit (INDEPENDENT_AMBULATORY_CARE_PROVIDER_SITE_OTHER): Payer: BC Managed Care – PPO | Admitting: Family Medicine

## 2020-02-20 VITALS — BP 133/82 | HR 85 | Temp 98.2°F | Resp 16 | Ht 64.0 in | Wt 305.0 lb

## 2020-02-20 DIAGNOSIS — R600 Localized edema: Secondary | ICD-10-CM | POA: Diagnosis not present

## 2020-02-20 DIAGNOSIS — Z760 Encounter for issue of repeat prescription: Secondary | ICD-10-CM

## 2020-02-20 DIAGNOSIS — R030 Elevated blood-pressure reading, without diagnosis of hypertension: Secondary | ICD-10-CM | POA: Diagnosis not present

## 2020-02-20 DIAGNOSIS — Z23 Encounter for immunization: Secondary | ICD-10-CM | POA: Diagnosis not present

## 2020-02-20 DIAGNOSIS — N83202 Unspecified ovarian cyst, left side: Secondary | ICD-10-CM

## 2020-02-20 DIAGNOSIS — M797 Fibromyalgia: Secondary | ICD-10-CM | POA: Diagnosis not present

## 2020-02-20 MED ORDER — TRIAMTERENE-HCTZ 37.5-25 MG PO TABS: 1.0000 | ORAL_TABLET | Freq: Every day | ORAL | 1 refills | Status: AC

## 2020-02-20 MED ORDER — GABAPENTIN 100 MG PO CAPS: ORAL_CAPSULE | ORAL | 1 refills | Status: AC

## 2020-02-20 NOTE — Progress Notes (Signed)
10/5/20212:54 PM  Lindsay Norman Aug 23, 1975, 44 y.o., female [id number]  Chief Complaint  Patient presents with  . Edema    pt notes she feels the same notes no real diffrence in her swelling, pt notes no changes in her urination     HPI:   Patient is a 44 y.o. female who presents today for followup on BLE  Last OV a week ago - started on maxizide for edema and HTN Cbc, cmp, tsh, bnp normal Feels edema sign better Tolerating bp med wo issues She is requesting referral to obgyn as had pelvic MRI done which showed ovarian cyst, she did not followup  1. Severe right hip osteoarthritis. Moderate left hip osteoarthritis.  2. Degenerative changes of the lower lumbar spine.  3. Nonspecific 2.1 cm lesion in the intertrochanteric left femur. This could be benign or malignant. Consider correlation with bonescan and SPEP/UPEP.  4. 1.8 cm left ovarian hemorrhagic cyst.   Alesia Morin, MD 03/17/2018 2:02 PM  Needs refill of gabapentin which she takes with cymbalta for fibromylagia  Works well for her   Depression screen [facility] 2/9 02/16/2020 11/10/2019 03/01/2017  Decreased Interest 0 0 0  Down, Depressed, Hopeless 0 0 0  PHQ - 2 Score 0 0 0  Altered sleeping - - -  Tired, decreased energy - - -  Change in appetite - - -  Feeling bad or failure about yourself  - - -  Trouble concentrating - - -  Moving slowly or fidgety/restless - - -  Suicidal thoughts - - -  PHQ-9 Score - - -  Difficult doing work/chores - - -    Fall Risk  02/16/2020 11/10/2019 11/10/2019 03/01/2017 09/08/2016  Falls in the past year? 0 0 0 No No  Number falls in past yr: 0 - - - -  Injury with Fall? 0 - - - -  Follow up Falls evaluation completed Falls evaluation completed Falls evaluation completed - -     Allergies  Allergen Reactions  . Celebrex [Celecoxib]   . Penicillins     Prior to Admission medications   Medication Sig Start Date End Date Taking? Authorizing Provider  DULoxetine (CYMBALTA)  60 MG capsule Take 1 capsule (60 mg total) by mouth daily. 04/28/19  Yes Janeece Agee, NP  gabapentin (NEURONTIN) 100 MG capsule TAKE 1 CAPSULE IN THE MORNING AND 2 CAPSULES AT BEDTIME. 11/10/19  Yes Peyton Najjar, MD  meloxicam (MOBIC) 15 MG tablet Take 1 tablet (15 mg total) by mouth daily. 11/10/19  Yes Peyton Najjar, MD  oxybutynin (DITROPAN-XL) 5 MG 24 hr tablet TAKE 1 TWICE DAILY TO DECREASE URINARY FREQUENCY. 01/28/20  Yes Malva Limes, MD  triamterene-hydrochlorothiazide (MAXZIDE-25) 37.5-25 MG tablet Take 1 tablet by mouth daily. 02/16/20  Yes Myles Lipps, MD    Past Medical History:  Diagnosis Date  . Fibromyalgia   . Osteogenesis imperfecta   . Osteopenia   . Polycystic ovarian syndrome   . Scoliosis     Past Surgical History:  Procedure Laterality Date  . CESAREAN SECTION    . CESAREAN SECTION N/A    [employer]rees[employer] 02/16/2020  . CHOLECYSTECTOMY    . KNEE SURGERY    . KNEE SURGERY Left     Social History   Tobacco Use  . Smoking status: Current Every Day Smoker    Packs/day: 0.50    Types: Cigarettes  . Smokeless tobacco: Never Used  Substance Use Topics  . Alcohol use: No  Family History  Problem Relation Age of Onset  . Fibromyalgia Mother   . Hypertension Mother   . Depression Mother   . Cancer Father        colon cancer  . Osteoarthritis Father   . Depression Brother     Review of Systems  Constitutional: Negative for chills and fever.  Respiratory: Negative for cough and shortness of breath.   Cardiovascular: Negative for chest pain and palpitations.  Gastrointestinal: Negative for abdominal pain, nausea and vomiting.  per hpi   OBJECTIVE:  Today's Vitals   02/20/20 1449  BP: 133/82  Pulse: 85  Resp: 16  Temp: 98.2 F (36.8 C)  TempSrc: Temporal  SpO2: 96%  Weight: (!) 305 lb (138.3 kg)  Height: 5\' 4"  (1.626 m)   Body mass index is 52.35 kg/m.  Wt Readings from Last 3 Encounters:  02/20/20 (!) 305 lb (138.3 kg)    02/16/20 (!) 308 lb 14.4 oz (140.1 kg)  11/10/19 (!) 301 lb 6.4 oz (136.7 kg)    Physical Exam Vitals and nursing note reviewed.  Constitutional:      Appearance: She is well-developed.  HENT:     Head: Normocephalic and atraumatic.     Mouth/Throat:     Pharynx: No oropharyngeal exudate.  Eyes:     General: No scleral icterus.    Extraocular Movements: Extraocular movements intact.     Conjunctiva/sclera: Conjunctivae normal.     Pupils: Pupils are equal, round, and reactive to light.  Cardiovascular:     Rate and Rhythm: Normal rate and regular rhythm.     Heart sounds: Normal heart sounds. No murmur heard.  No friction rub. No gallop.   Pulmonary:     Effort: Pulmonary effort is normal.     Breath sounds: Normal breath sounds. No wheezing, rhonchi or rales.  Musculoskeletal:     Cervical back: Neck supple.     Right lower leg: Edema (improved BLE) present.     Left lower leg: Edema present.  Skin:    General: Skin is warm and dry.  Neurological:     Mental Status: She is alert and oriented to person, place, and time.     No results found for this or any previous visit (from the past 24 hour(s)).  No results found.   ASSESSMENT and PLAN  1. Cyst of left ovary Advised hemorrhagic cyst are benign and do not require monitoring. Patient requesting referral which was placed. - Ambulatory referral to Obstetrics / Gynecology  2. Bilateral lower extremity edema Improving. Cont current regime, reviewed conservative measures.  3. Elevated BP without diagnosis of hypertension Improved BP  4. Fibromyalgia Controlled. Continue current regime.   5. Medication refill - gabapentin (NEURONTIN) 100 MG capsule; TAKE 1 CAPSULE IN THE MORNING AND 2 CAPSULES AT BEDTIME.  Other orders - Flu Vaccine QUAD 6+ mos PF IM (Fluarix Quad PF) - triamterene-hydrochlorothiazide (MAXZIDE-25) 37.5-25 MG tablet; Take 1 tablet by mouth daily.  Return in about 3 months (around  05/22/2020).    07/20/2020, MD Primary Care at [facility] [address] [place], [place] Ph.  [phone] Fax [phone]

## 2020-02-20 NOTE — Patient Instructions (Signed)
° ° ° °  If you have lab work done today you will be contacted with your lab results within the next 2 weeks.  If you have not heard from us then please contact us. The fastest way to get your results is to register for My Chart. ° ° °IF you received an x-ray today, you will receive an invoice from [facility]. Please contact [facility] at [phone] with questions or concerns regarding your invoice.  ° °IF you received labwork today, you will receive an invoice from [facility]. Please contact [facility] at [phone] with questions or concerns regarding your invoice.  ° °Our billing staff will not be able to assist you with questions regarding bills from these companies. ° °You will be contacted with the lab results as soon as they are available. The fastest way to get your results is to activate your My Chart account. Instructions are located on the last page of this paperwork. If you have not heard from us regarding the results in 2 weeks, please contact this office. °  ° ° ° °

## 2020-04-19 ENCOUNTER — Encounter: Payer: BC Managed Care – PPO | Admitting: Obstetrics & Gynecology

## 2020-05-12 ENCOUNTER — Other Ambulatory Visit: Payer: Self-pay | Admitting: Family Medicine

## 2020-05-12 ENCOUNTER — Other Ambulatory Visit: Payer: Self-pay | Admitting: Registered Nurse

## 2020-05-12 DIAGNOSIS — M797 Fibromyalgia: Secondary | ICD-10-CM

## 2020-05-12 DIAGNOSIS — M158 Other polyosteoarthritis: Secondary | ICD-10-CM

## 2020-05-12 NOTE — Telephone Encounter (Signed)
Requested Prescriptions  Pending Prescriptions Disp Refills  . meloxicam (MOBIC) 15 MG tablet [Pharmacy Med Name: MELOXICAM 15 MG TABLET] 90 tablet 0    Sig: TAKE 1 TABLET BY MOUTH EVERY DAY     Analgesics:  COX2 Inhibitors Passed - 05/12/2020 12:41 AM      Passed - HGB in normal range and within 360 days    Hemoglobin  Date Value Ref Range Status  02/16/2020 12.4 11.1 - 15.9 g/dL Final         Passed - Cr in normal range and within 360 days    Creat  Date Value Ref Range Status  06/04/2015 0.74 0.50 - 1.10 mg/dL Final   Creatinine, Ser  Date Value Ref Range Status  02/16/2020 0.66 0.57 - 1.00 mg/dL Final         Passed - Patient is not pregnant      Passed - Valid encounter within last 12 months    Recent Outpatient Visits          2 months ago Cyst of left ovary   Primary Care at [facility], Meda Coffee, MD   2 months ago Bilateral lower extremity edema   Primary Care at [facility], Meda Coffee, MD   6 months ago Urinary frequency   Primary Care at [facility], Sandria Bales, MD   1 year ago Fibromyalgia   [facility] at [facility], Gerlene Burdock, NP   3 years ago Body aches   Primary Care at [facility] Marolyn Hammock, PA-C

## 2020-05-12 NOTE — Telephone Encounter (Signed)
Requested Prescriptions  Pending Prescriptions Disp Refills  . DULoxetine (CYMBALTA) 60 MG capsule [Pharmacy Med Name: DULOXETINE HCL DR 60 MG CAP] 90 capsule 0    Sig: TAKE 1 CAPSULE BY MOUTH EVERY DAY     Psychiatry: Antidepressants - SNRI Passed - 05/12/2020 12:41 AM      Passed - Last BP in normal range    BP Readings from Last 1 Encounters:  02/20/20 133/82         Passed - Valid encounter within last 6 months    Recent Outpatient Visits          2 months ago Cyst of left ovary   Primary Care at [facility], Meda Coffee, MD   2 months ago Bilateral lower extremity edema   Primary Care at [facility], Meda Coffee, MD   6 months ago Urinary frequency   Primary Care at [facility], Sandria Bales, MD   1 year ago Fibromyalgia   [facility] at [facility], Gerlene Burdock, NP   3 years ago Body aches   Primary Care at [facility], Marolyn Hammock, PA-C

## 2020-08-06 ENCOUNTER — Other Ambulatory Visit: Payer: Self-pay | Admitting: Registered Nurse

## 2020-08-06 DIAGNOSIS — M797 Fibromyalgia: Secondary | ICD-10-CM

## 2020-08-06 NOTE — Telephone Encounter (Signed)
Requested medications are due for refill today yes  Requested medications are on the active medication list yes  Last refill 12/26  Last visit Do not see this med/dx addressed in any visit  Future visit scheduled no  Notes to clinic Please assess.

## 2020-08-07 ENCOUNTER — Other Ambulatory Visit: Payer: Self-pay | Admitting: Family Medicine

## 2020-08-07 DIAGNOSIS — M158 Other polyosteoarthritis: Secondary | ICD-10-CM

## 2020-08-07 NOTE — Telephone Encounter (Signed)
Requested medications are due for refill today.  yes  Requested medications are on the active medications list.  yes  Last refill. 05/12/2020  Future visit scheduled.   no  Notes to clinic.  Has not been seen for Arthritis in over 1 year - please advise.

## 2020-08-10 ENCOUNTER — Other Ambulatory Visit: Payer: Self-pay | Admitting: Family Medicine

## 2020-08-10 DIAGNOSIS — R350 Frequency of micturition: Secondary | ICD-10-CM

## 2020-08-10 DIAGNOSIS — R3915 Urgency of urination: Secondary | ICD-10-CM

## 2020-08-10 NOTE — Telephone Encounter (Signed)
Requested medications are due for refill today yes  Requested medications are on the active medication list yes  Last refill 12/17  Last visit 6/21  Future visit scheduled no  Notes to clinic No PCP listed, is this pt still associated with your practice?

## 2020-08-12 NOTE — Telephone Encounter (Signed)
Not a patient at our office Thanks

## 2020-08-12 NOTE — Telephone Encounter (Signed)
Requested medications are due for refill today yes  Requested medications are on the active medication list yes  Last refill 12/17  Last visit 10/2019 (last visit that addressed dx/med)  Future visit scheduled no  Notes to clinic No PCP listed, however, Dr. Sherrie Mustache wrote this rx, is this pt still associated with your practice?

## 2020-08-12 NOTE — Telephone Encounter (Signed)
  Notes to clinic Sorry, just saw this was already sent. Please disregard prior CRM

## 2021-01-01 ENCOUNTER — Ambulatory Visit: Payer: BC Managed Care – PPO | Admitting: Orthopaedic Surgery

## 2021-05-10 IMAGING — CT CT HEAD W/O CM
3 series · 15 of 47 positions shown, 18 images · non-contrast
Comparison: 03/05/2010

CLINICAL DATA: Headache for 2 days.

EXAM:
CT HEAD WITHOUT CONTRAST
TECHNIQUE: Contiguous axial images were obtained from the base of the skull
through the vertex without intravenous contrast.

[Series 2: head wo · axial · 0.42mm/px · z∈[+998,+1123]mm · 9 of 30 slices shown, 12 images]
[im 3/30  brain]
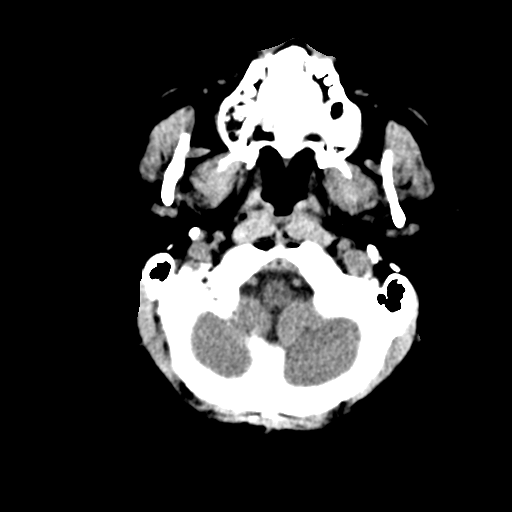
[im 3/30  bone]
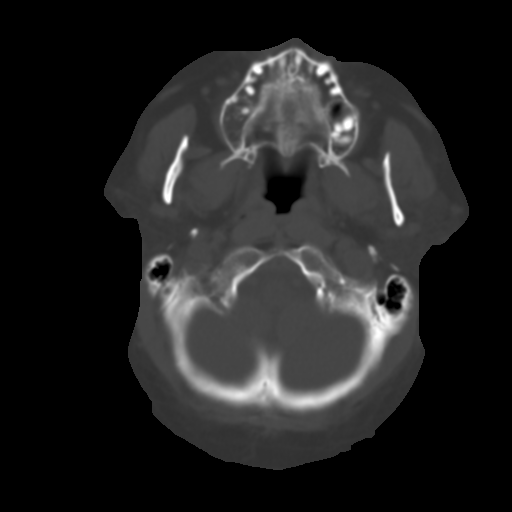
[im 6/30  brain]
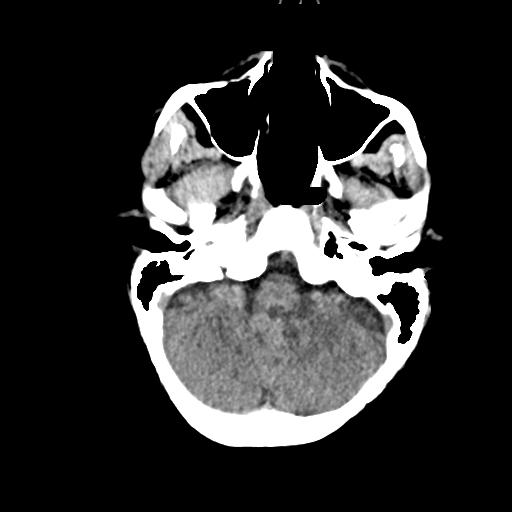
[im 9/30  brain]
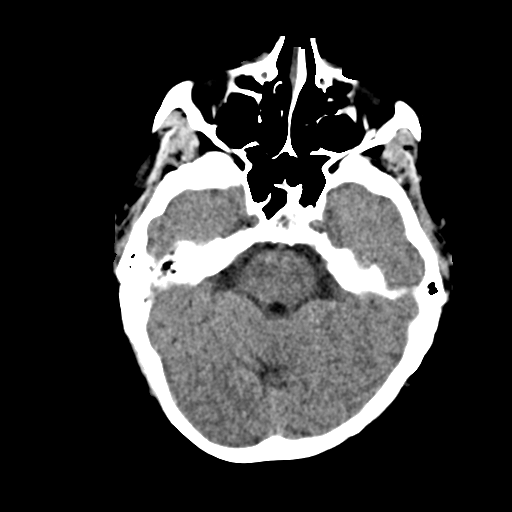
[im 12/30  brain]
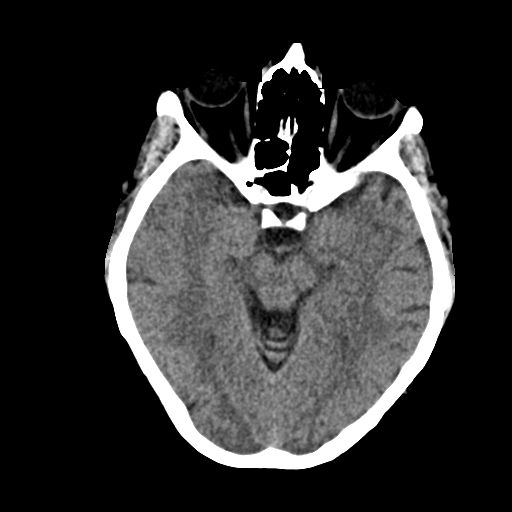
[im 16/30  brain]
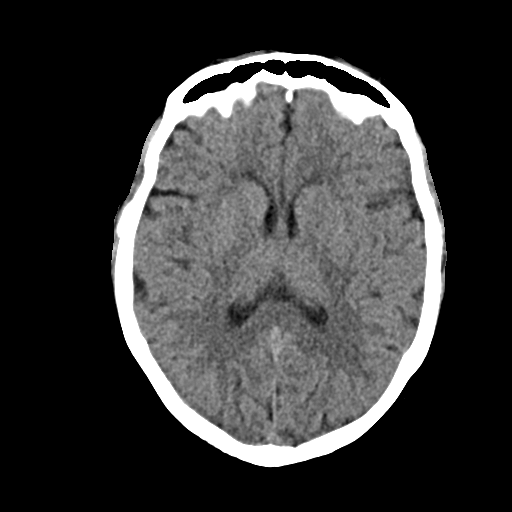
[im 16/30  bone]
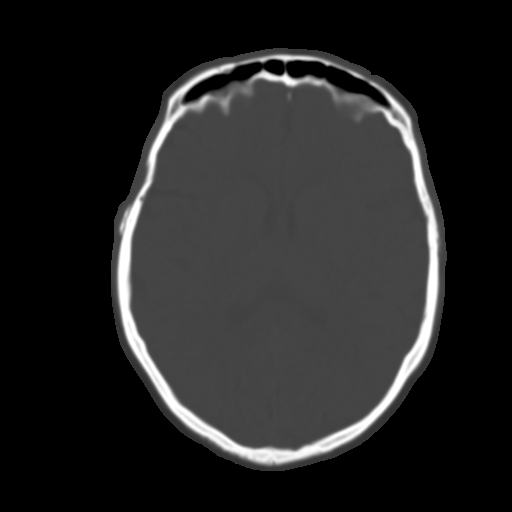
[im 19/30  brain]
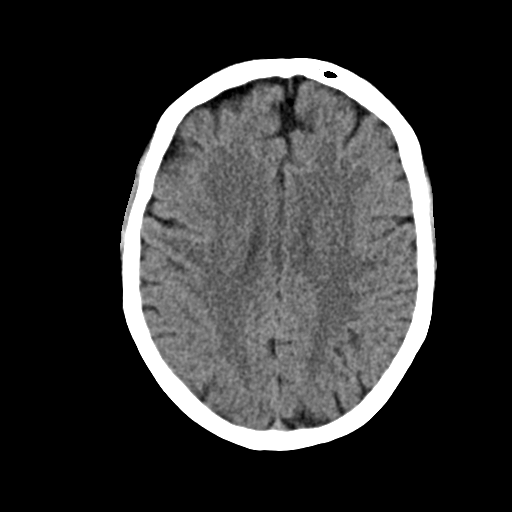
[im 22/30  brain]
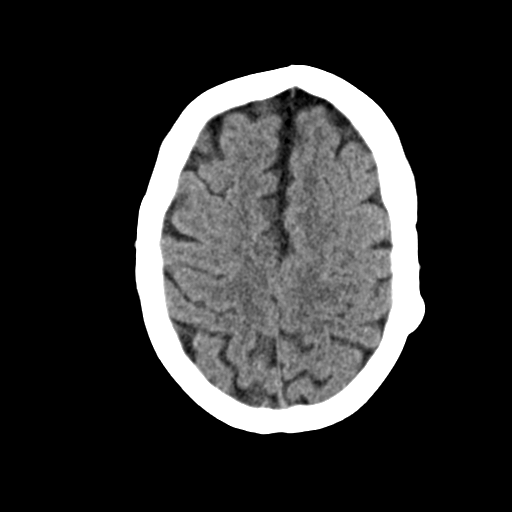
[im 25/30  brain]
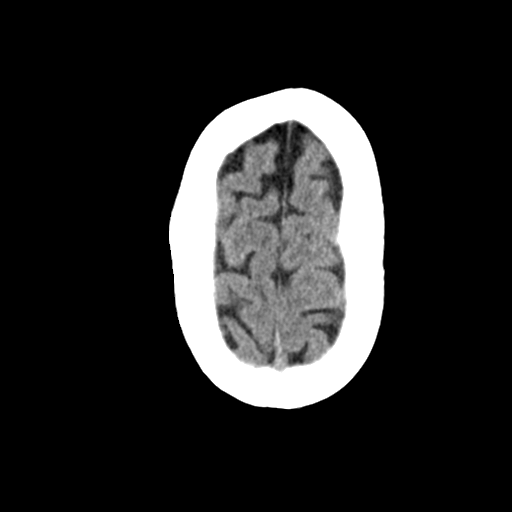
[im 28/30  brain]
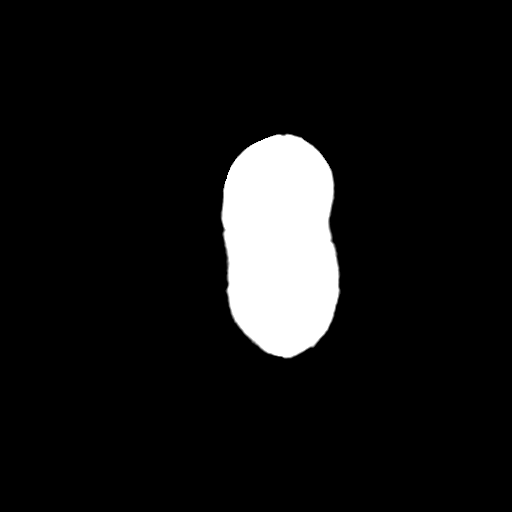
[im 28/30  bone]
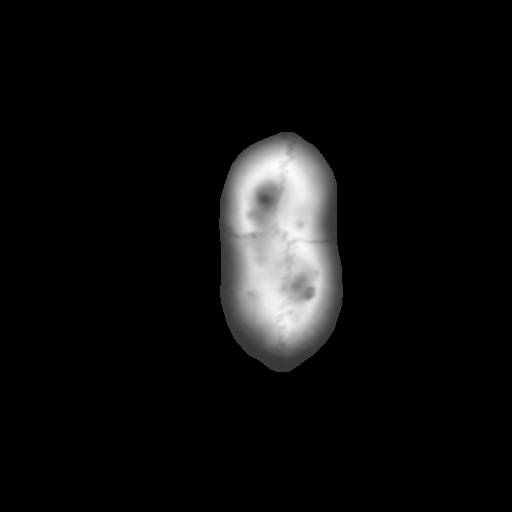

[Series 4: coronal soft · coronal · 0.32mm/px · 3 of 67 slices shown]
[im 23/67  brain]
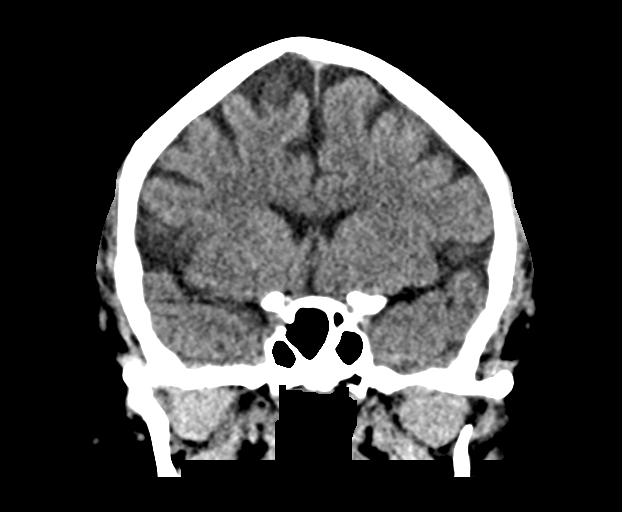
[im 30/67  brain]
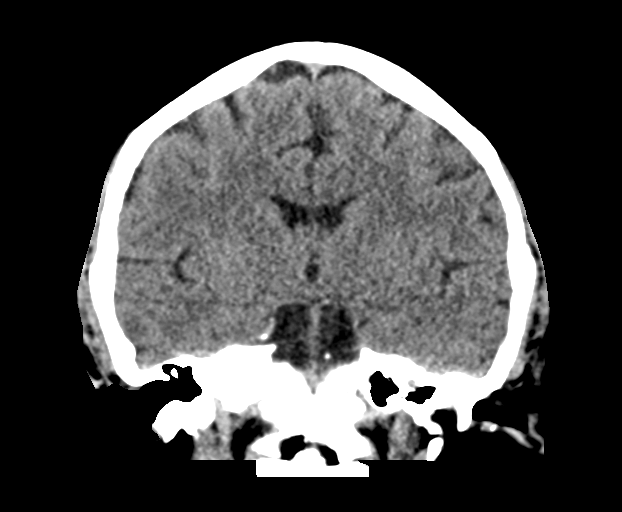
[im 37/67  brain]
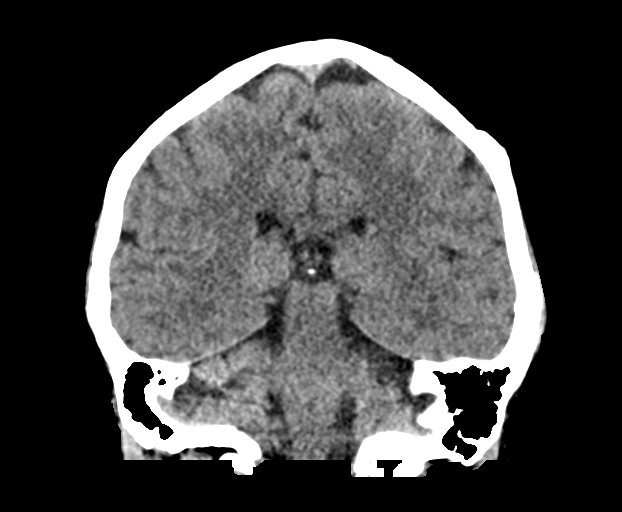

[Series 5: sag soft · sagittal · 0.29mm/px · 3 of 62 slices shown]
[im 21/62  brain]
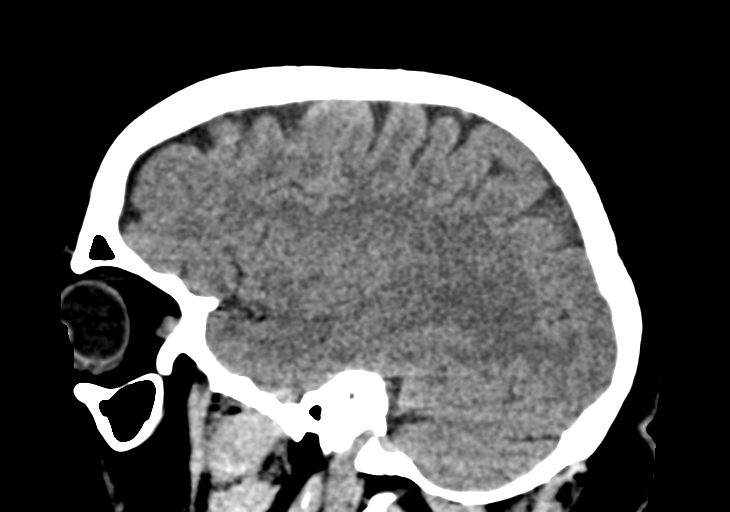
[im 31/62  brain]
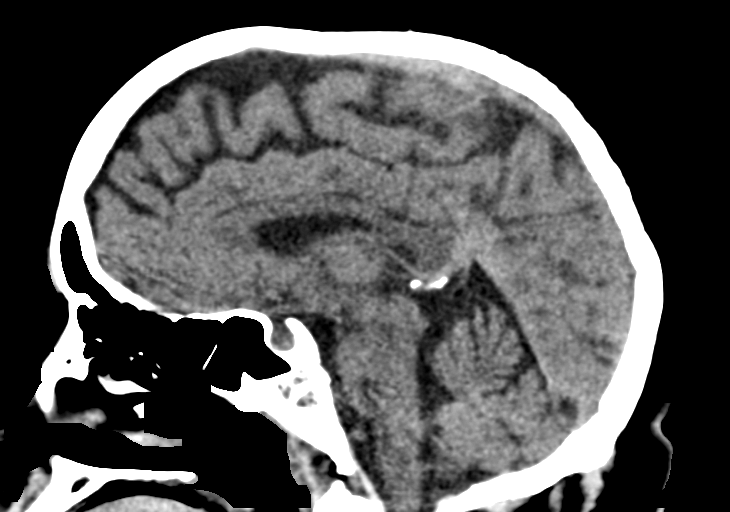
[im 41/62  brain]
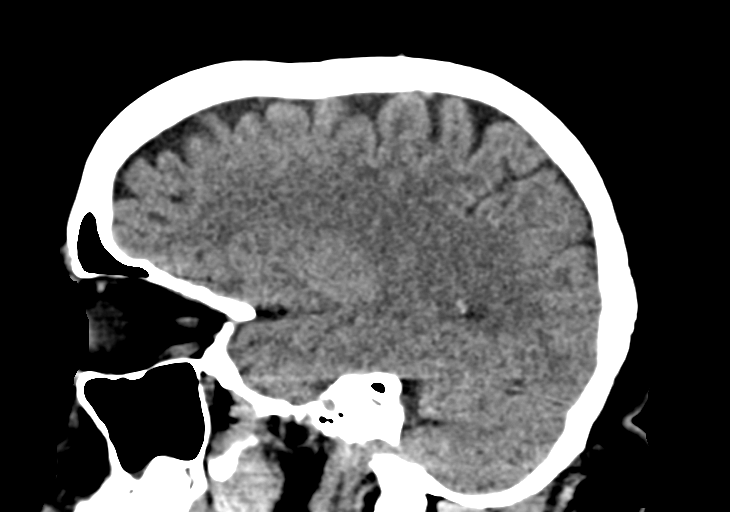

[15 of 47 positions shown; findings below may reference images not displayed]

FINDINGS: Brain: No evidence of acute infarction, hemorrhage, hydrocephalus,
extra-axial collection or mass lesion/mass effect.

Vascular: No hyperdense vessel or unexpected calcification.

Skull: Normal. Negative for fracture or focal lesion.

Sinuses/Orbits: Normal globes and orbits.  Clear sinuses.

Other: None.
IMPRESSION: Normal enhanced CT scan of the brain.

## 2022-03-21 ENCOUNTER — Other Ambulatory Visit: Payer: Self-pay

## 2022-03-21 ENCOUNTER — Encounter (HOSPITAL_BASED_OUTPATIENT_CLINIC_OR_DEPARTMENT_OTHER): Payer: Self-pay

## 2022-03-21 DIAGNOSIS — R112 Nausea with vomiting, unspecified: Secondary | ICD-10-CM | POA: Diagnosis present

## 2022-03-21 DIAGNOSIS — Z20822 Contact with and (suspected) exposure to covid-19: Secondary | ICD-10-CM | POA: Insufficient documentation

## 2022-03-21 DIAGNOSIS — Z79899 Other long term (current) drug therapy: Secondary | ICD-10-CM | POA: Insufficient documentation

## 2022-03-21 DIAGNOSIS — E876 Hypokalemia: Secondary | ICD-10-CM | POA: Diagnosis not present

## 2022-03-21 DIAGNOSIS — D649 Anemia, unspecified: Secondary | ICD-10-CM | POA: Insufficient documentation

## 2022-03-21 DIAGNOSIS — I10 Essential (primary) hypertension: Secondary | ICD-10-CM | POA: Insufficient documentation

## 2022-03-21 LAB — CBC WITH DIFFERENTIAL/PLATELET
Abs Immature Granulocytes: 0.04 10*3/uL (ref 0.00–0.07)
Basophils Absolute: 0 10*3/uL (ref 0.0–0.1)
Basophils Relative: 0 %
Eosinophils Absolute: 0.1 10*3/uL (ref 0.0–0.5)
Eosinophils Relative: 1 %
HCT: 34.6 % — ABNORMAL LOW (ref 36.0–46.0)
Hemoglobin: 11.4 g/dL — ABNORMAL LOW (ref 12.0–15.0)
Immature Granulocytes: 1 %
Lymphocytes Relative: 24 %
Lymphs Abs: 2.1 10*3/uL (ref 0.7–4.0)
MCH: 29.5 pg (ref 26.0–34.0)
MCHC: 32.9 g/dL (ref 30.0–36.0)
MCV: 89.6 fL (ref 80.0–100.0)
Monocytes Absolute: 0.5 10*3/uL (ref 0.1–1.0)
Monocytes Relative: 6 %
Neutro Abs: 5.9 10*3/uL (ref 1.7–7.7)
Neutrophils Relative %: 68 %
Platelets: 300 10*3/uL (ref 150–400)
RBC: 3.86 MIL/uL — ABNORMAL LOW (ref 3.87–5.11)
RDW: 13.2 % (ref 11.5–15.5)
WBC: 8.7 10*3/uL (ref 4.0–10.5)
nRBC: 0 % (ref 0.0–0.2)

## 2022-03-21 LAB — URINALYSIS, MICROSCOPIC (REFLEX)

## 2022-03-21 LAB — PREGNANCY, URINE: Preg Test, Ur: NEGATIVE

## 2022-03-21 LAB — COMPREHENSIVE METABOLIC PANEL
ALT: 25 U/L (ref 0–44)
AST: 22 U/L (ref 15–41)
Albumin: 3.5 g/dL (ref 3.5–5.0)
Alkaline Phosphatase: 79 U/L (ref 38–126)
Anion gap: 8 (ref 5–15)
BUN: 10 mg/dL (ref 6–20)
CO2: 26 mmol/L (ref 22–32)
Calcium: 8.4 mg/dL — ABNORMAL LOW (ref 8.9–10.3)
Chloride: 104 mmol/L (ref 98–111)
Creatinine, Ser: 0.67 mg/dL (ref 0.44–1.00)
GFR, Estimated: >60 mL/min (ref >60)
Glucose, Bld: 109 mg/dL — ABNORMAL HIGH (ref 70–99)
Potassium: 3.3 mmol/L — ABNORMAL LOW (ref 3.5–5.1)
Sodium: 138 mmol/L (ref 135–145)
Total Bilirubin: 0.4 mg/dL (ref 0.3–1.2)
Total Protein: 6.8 g/dL (ref 6.5–8.1)

## 2022-03-21 LAB — URINALYSIS, ROUTINE W REFLEX MICROSCOPIC
Glucose, UA: NEGATIVE mg/dL
Ketones, ur: NEGATIVE mg/dL
Leukocytes,Ua: NEGATIVE
Nitrite: NEGATIVE
Protein, ur: NEGATIVE mg/dL
Specific Gravity, Urine: >=1.03 (ref 1.005–1.030)
pH: 6 (ref 5.0–8.0)

## 2022-03-21 MED ORDER — ONDANSETRON 4 MG PO TBDP
4.0000 mg | ORAL_TABLET | Freq: Once | ORAL | Status: AC | PRN
  Administered 2022-03-21: 4 mg via ORAL
  Filled 2022-03-21: qty 1

## 2022-03-21 NOTE — ED Triage Notes (Signed)
Last episode of emesis yesterday morning.

## 2022-03-21 NOTE — ED Triage Notes (Signed)
PT reports intermittent vomiting since halloween with lack of appetite, fatigue. Denies fever, pain.

## 2022-03-22 ENCOUNTER — Emergency Department (HOSPITAL_BASED_OUTPATIENT_CLINIC_OR_DEPARTMENT_OTHER)
Admission: EM | Admit: 2022-03-22 | Discharge: 2022-03-22 | Disposition: A | Discharge status: Home / Self Care | Payer: BC Managed Care – PPO | Attending: Emergency Medicine | Admitting: Emergency Medicine

## 2022-03-22 ENCOUNTER — Encounter (HOSPITAL_BASED_OUTPATIENT_CLINIC_OR_DEPARTMENT_OTHER): Payer: Self-pay | Admitting: Emergency Medicine

## 2022-03-22 ENCOUNTER — Emergency Department (HOSPITAL_BASED_OUTPATIENT_CLINIC_OR_DEPARTMENT_OTHER): Payer: BC Managed Care – PPO

## 2022-03-22 DIAGNOSIS — R112 Nausea with vomiting, unspecified: Secondary | ICD-10-CM

## 2022-03-22 DIAGNOSIS — R5383 Other fatigue: Secondary | ICD-10-CM

## 2022-03-22 DIAGNOSIS — I10 Essential (primary) hypertension: Secondary | ICD-10-CM

## 2022-03-22 DIAGNOSIS — E876 Hypokalemia: Secondary | ICD-10-CM

## 2022-03-22 LAB — SARS CORONAVIRUS 2 BY RT PCR: SARS Coronavirus 2 by RT PCR: NEGATIVE

## 2022-03-22 LAB — TROPONIN I (HIGH SENSITIVITY): Troponin I (High Sensitivity): 4 ng/L (ref <18)

## 2022-03-22 MED ORDER — KETOROLAC TROMETHAMINE 30 MG/ML IJ SOLN
15.0000 mg | Freq: Once | INTRAMUSCULAR | Status: AC
  Administered 2022-03-22: 15 mg via INTRAVENOUS
  Filled 2022-03-22: qty 1

## 2022-03-22 MED ORDER — ONDANSETRON 8 MG PO TBDP: ORAL_TABLET | ORAL | 0 refills | Status: AC

## 2022-03-22 MED ORDER — LIDOCAINE 5 % EX PTCH
2.0000 | MEDICATED_PATCH | CUTANEOUS | Status: DC
  Administered 2022-03-22: 2 via TRANSDERMAL
  Filled 2022-03-22: qty 2

## 2022-03-22 MED ORDER — SODIUM CHLORIDE 0.9 % IV BOLUS
500.0000 mL | Freq: Once | INTRAVENOUS | Status: AC
  Administered 2022-03-22: 500 mL via INTRAVENOUS

## 2022-03-22 MED ORDER — AMLODIPINE BESYLATE 5 MG PO TABS
5.0000 mg | ORAL_TABLET | Freq: Once | ORAL | Status: AC
  Administered 2022-03-22: 5 mg via ORAL
  Filled 2022-03-22: qty 1

## 2022-03-22 MED ORDER — POTASSIUM CHLORIDE CRYS ER 20 MEQ PO TBCR
20.0000 meq | EXTENDED_RELEASE_TABLET | Freq: Once | ORAL | Status: AC
  Administered 2022-03-22: 20 meq via ORAL
  Filled 2022-03-22: qty 1

## 2022-03-22 MED ORDER — FAMOTIDINE IN NACL 20-0.9 MG/50ML-% IV SOLN
20.0000 mg | Freq: Once | INTRAVENOUS | Status: AC
  Administered 2022-03-22: 20 mg via INTRAVENOUS
  Filled 2022-03-22: qty 50

## 2022-03-22 NOTE — ED Provider Notes (Signed)
[facility] EMERGENCY DEPARTMENT Provider Note   CSN: [id number] Arrival date & time: 03/21/22  2224     History  Chief Complaint  Patient presents with   Emesis    Lindsay Norman is a 46 y.o. female.  The history is provided by the patient.  Emesis Severity:  Moderate Duration:  4 days Timing:  Intermittent Progression:  Unchanged Chronicity:  New Relieved by:  Nothing Associated symptoms: no abdominal pain, no cough, no diarrhea, no fever, no sore throat and no URI   Risk factors: no diabetes   Patient with HTN and fibromyalgia presents with fatigue and nausea and emesis since Tuesday.  No fevers, no cough or congestion.  No diarrhea.       Home Medications Prior to Admission medications   Medication Sig Start Date End Date Taking? Authorizing Provider  ondansetron (ZOFRAN-ODT) 8 MG disintegrating tablet 8mg  ODT q8 hours prn nausea 03/22/22  Yes Arif Amendola, Malyiah, MD  DULoxetine (CYMBALTA) 60 MG capsule TAKE 1 CAPSULE BY MOUTH EVERY DAY 05/12/20   05/14/20, NP  gabapentin (NEURONTIN) 100 MG capsule TAKE 1 CAPSULE IN THE MORNING AND 2 CAPSULES AT BEDTIME. 02/20/20   04/21/20, Lezlie Lye, MD  meloxicam (MOBIC) 15 MG tablet TAKE 1 TABLET BY MOUTH EVERY DAY 05/12/20   05/14/20, MD  oxybutynin (DITROPAN-XL) 5 MG 24 hr tablet TAKE 1 TWICE DAILY TO DECREASE URINARY FREQUENCY. 01/28/20   03/29/20, MD  triamterene-hydrochlorothiazide (MAXZIDE-25) 37.5-25 MG tablet Take 1 tablet by mouth daily. 02/20/20   04/21/20, MD      Allergies    Celebrex [celecoxib] and Penicillins    Review of Systems   Review of Systems  Constitutional:  Negative for fever.  HENT:  Negative for sore throat.   Respiratory:  Negative for cough.   Gastrointestinal:  Positive for nausea and vomiting. Negative for abdominal pain and diarrhea.  Genitourinary:  Negative for dysuria.    Physical Exam Updated Vital Signs BP 135/85   Pulse 73   Temp 98.1 F  (36.7 C) (Oral)   Resp 18   Ht 5\' 3"  (1.6 m)   Wt 99.8 kg   SpO2 97%   BMI 38.97 kg/m  Physical Exam Vitals and nursing note reviewed.  Constitutional:      General: She is not in acute distress.    Appearance: Normal appearance. She is well-developed.  HENT:     Head: Normocephalic and atraumatic.     Nose: Nose normal.  Eyes:     Pupils: Pupils are equal, round, and reactive to light.  Cardiovascular:     Rate and Rhythm: Normal rate and regular rhythm.     Pulses: Normal pulses.     Heart sounds: Normal heart sounds.  Pulmonary:     Effort: Pulmonary effort is normal. No respiratory distress.     Breath sounds: Normal breath sounds.  Abdominal:     General: Bowel sounds are normal. There is no distension.     Palpations: Abdomen is soft.     Tenderness: There is no abdominal tenderness. There is no guarding or rebound.  Genitourinary:    Vagina: No vaginal discharge.  Musculoskeletal:        General: Normal range of motion.     Cervical back: Neck supple.  Skin:    General: Skin is warm and dry.     Capillary Refill: Capillary refill takes less than 2 seconds.     Findings:  No erythema or rash.  Neurological:     General: No focal deficit present.     Mental Status: She is alert and oriented to person, place, and time.     Deep Tendon Reflexes: Reflexes normal.  Psychiatric:        Mood and Affect: Mood normal.        Thought Content: Thought content normal.     ED Results / Procedures / Treatments   Labs (all labs ordered are listed, but only abnormal results are displayed) Results for orders placed or performed during the hospital encounter of 03/22/22  SARS Coronavirus 2 by RT PCR (hospital order, performed in [facility] hospital lab) *cepheid single result test* Anterior Nasal Swab   Specimen: Anterior Nasal Swab  Result Value Ref Range   SARS Coronavirus 2 by RT PCR NEGATIVE NEGATIVE  Pregnancy, urine  Result Value Ref Range   Preg Test, Ur NEGATIVE  NEGATIVE  Comprehensive metabolic panel  Result Value Ref Range   Sodium 138 135 - 145 mmol/L   Potassium 3.3 (L) 3.5 - 5.1 mmol/L   Chloride 104 98 - 111 mmol/L   CO2 26 22 - 32 mmol/L   Glucose, Bld 109 (H) 70 - 99 mg/dL   BUN 10 6 - 20 mg/dL   Creatinine, Ser 0.10 0.44 - 1.00 mg/dL   Calcium 8.4 (L) 8.9 - 10.3 mg/dL   Total Protein 6.8 6.5 - 8.1 g/dL   Albumin 3.5 3.5 - 5.0 g/dL   AST 22 15 - 41 U/L   ALT 25 0 - 44 U/L   Alkaline Phosphatase 79 38 - 126 U/L   Total Bilirubin 0.4 0.3 - 1.2 mg/dL   GFR, Estimated >27 >25 mL/min   Anion gap 8 5 - 15  CBC with Differential  Result Value Ref Range   WBC 8.7 4.0 - 10.5 K/uL   RBC 3.86 (L) 3.87 - 5.11 MIL/uL   Hemoglobin 11.4 (L) 12.0 - 15.0 g/dL   HCT 36.6 (L) 44.0 - 34.7 %   MCV 89.6 80.0 - 100.0 fL   MCH 29.5 26.0 - 34.0 pg   MCHC 32.9 30.0 - 36.0 g/dL   RDW 42.5 95.6 - 38.7 %   Platelets 300 150 - 400 K/uL   nRBC 0.0 0.0 - 0.2 %   Neutrophils Relative % 68 %   Neutro Abs 5.9 1.7 - 7.7 K/uL   Lymphocytes Relative 24 %   Lymphs Abs 2.1 0.7 - 4.0 K/uL   Monocytes Relative 6 %   Monocytes Absolute 0.5 0.1 - 1.0 K/uL   Eosinophils Relative 1 %   Eosinophils Absolute 0.1 0.0 - 0.5 K/uL   Basophils Relative 0 %   Basophils Absolute 0.0 0.0 - 0.1 K/uL   Immature Granulocytes 1 %   Abs Immature Granulocytes 0.04 0.00 - 0.07 K/uL  Urinalysis, Routine w reflex microscopic Urine, Clean Catch  Result Value Ref Range   Color, Urine YELLOW YELLOW   APPearance HAZY (A) CLEAR   Specific Gravity, Urine >=1.030 1.005 - 1.030   pH 6.0 5.0 - 8.0   Glucose, UA NEGATIVE NEGATIVE mg/dL   Hgb urine dipstick LARGE (A) NEGATIVE   Bilirubin Urine SMALL (A) NEGATIVE   Ketones, ur NEGATIVE NEGATIVE mg/dL   Protein, ur NEGATIVE NEGATIVE mg/dL   Nitrite NEGATIVE NEGATIVE   Leukocytes,Ua NEGATIVE NEGATIVE  Urinalysis, Microscopic (reflex)  Result Value Ref Range   RBC / HPF 0-5 0 - 5 RBC/hpf   WBC, UA  0-5 0 - 5 WBC/hpf   Bacteria, UA FEW  (A) NONE SEEN   Squamous Epithelial / LPF 6-10 0 - 5   Mucus PRESENT   Troponin I (High Sensitivity)  Result Value Ref Range   Troponin I (High Sensitivity) 4 <18 ng/L   CT Renal Stone Study  Result Date: 03/22/2022 CLINICAL DATA:  Vomiting EXAM: CT ABDOMEN AND PELVIS WITHOUT CONTRAST TECHNIQUE: Multidetector CT imaging of the abdomen and pelvis was performed following the standard protocol without IV contrast. RADIATION DOSE REDUCTION: This exam was performed according to the departmental dose-optimization program which includes automated exposure control, adjustment of the mA and/or kV according to patient size and/or use of iterative reconstruction technique. COMPARISON:  None Available. FINDINGS: Lower chest: Lung bases are clear. Hepatobiliary: In a liver is unremarkable. Status post cholecystectomy. No intrahepatic or extrahepatic duct dilatation. Pancreas: Within normal limits. Spleen: Within normal limits. Adrenals/Urinary Tract: Adrenal glands are within normal limits. Kidneys are within normal limits. No renal calculi or hydronephrosis. Bladder is underdistended but unremarkable. Stomach/Bowel: Stomach is within normal limits. No evidence of bowel obstruction. Normal appendix (series 2/image 52). No colonic wall thickening or inflammatory changes. Vascular/Lymphatic: No evidence of abdominal aortic aneurysm. No suspicious abdominopelvic lymphadenopathy. Reproductive: Mildly heterogeneous uterine fundus, suggesting a fundal fibroid. Bilateral ovaries are within normal limits. Other: No abdominopelvic ascites. Musculoskeletal: Mild degenerative changes of the lower thoracic spine. IMPRESSION: No evidence of bowel obstruction. Normal appendix. Status post cholecystectomy. Electronically Signed   By: Charline Bills M.D.   On: 03/22/2022 00:36    EKG EKG Interpretation  Date/Time:  Sunday March 22 2022 00:32:41 EDT Ventricular Rate:  75 PR Interval:  203 QRS Duration: 110 QT  Interval:  391 QTC Calculation: 437 R Axis:   47 Text Interpretation: Sinus rhythm Borderline prolonged PR interval Confirmed by Governor Matos, Novelle ([id number]) on 03/22/2022 12:33:38 AM  Radiology CT Renal Stone Study  Result Date: 03/22/2022 CLINICAL DATA:  Vomiting EXAM: CT ABDOMEN AND PELVIS WITHOUT CONTRAST TECHNIQUE: Multidetector CT imaging of the abdomen and pelvis was performed following the standard protocol without IV contrast. RADIATION DOSE REDUCTION: This exam was performed according to the departmental dose-optimization program which includes automated exposure control, adjustment of the mA and/or kV according to patient size and/or use of iterative reconstruction technique. COMPARISON:  None Available. FINDINGS: Lower chest: Lung bases are clear. Hepatobiliary: In a liver is unremarkable. Status post cholecystectomy. No intrahepatic or extrahepatic duct dilatation. Pancreas: Within normal limits. Spleen: Within normal limits. Adrenals/Urinary Tract: Adrenal glands are within normal limits. Kidneys are within normal limits. No renal calculi or hydronephrosis. Bladder is underdistended but unremarkable. Stomach/Bowel: Stomach is within normal limits. No evidence of bowel obstruction. Normal appendix (series 2/image 52). No colonic wall thickening or inflammatory changes. Vascular/Lymphatic: No evidence of abdominal aortic aneurysm. No suspicious abdominopelvic lymphadenopathy. Reproductive: Mildly heterogeneous uterine fundus, suggesting a fundal fibroid. Bilateral ovaries are within normal limits. Other: No abdominopelvic ascites. Musculoskeletal: Mild degenerative changes of the lower thoracic spine. IMPRESSION: No evidence of bowel obstruction. Normal appendix. Status post cholecystectomy. Electronically Signed   By: Charline Bills M.D.   On: 03/22/2022 00:36    Procedures Procedures    Medications Ordered in ED Medications  ketorolac (TORADOL) 30 MG/ML injection 15 mg (has no  administration in time range)  lidocaine (LIDODERM) 5 % 2 patch (has no administration in time range)  ondansetron (ZOFRAN-ODT) disintegrating tablet 4 mg (4 mg Oral Given 03/21/22 2306)  sodium chloride 0.9 % bolus 500 mL (  0 mLs Intravenous Stopped 03/22/22 0057)  famotidine (PEPCID) IVPB 20 mg premix (0 mg Intravenous Stopped 03/22/22 0057)  amLODipine (NORVASC) tablet 5 mg (5 mg Oral Given 03/22/22 0028)    ED Course/ Medical Decision Making/ A&P                           Medical Decision Making Patient with hypertension and fibromyalgia who presents with nausea and vomiting and fatigue   Problems Addressed: Hypokalemia:    Details: Treated with potassium in the ED Nausea and vomiting, unspecified vomiting type: acute illness or injury    Details: No further episodes, will discharge with zofran  Other fatigue:    Details: Hydrated in the ED, follow up with family doctor for ongoing care  Primary hypertension: chronic illness or injury    Details: Medication restarted in the ED  Amount and/or Complexity of Data Reviewed External Data Reviewed: labs and notes.    Details: Previous outside notes and labs reviewed  Labs: ordered.    Details: All labs reviewed:  negative covid. urine has blood as patient is on menstrual cycle.  Normal troponin 4.  Normal white count 8.7, hemoglobin slightly low 11.4, normal platelets 300K. Normal sodium 138, potassium slightly low 3.3, normal creatinine .67, normal LFTS Radiology: ordered and independent interpretation performed.    Details: Negative CT by me  ECG/medicine tests: ordered and independent interpretation performed. Decision-making details documented in ED Course.  Risk Prescription drug management. Risk Details: Given ongoing symptoms one troponin is sufficient to rule out ACS. Heart score is 1, low risk for MACE.  No signs of severe metabolic derangement on labs.  Well appearing.  Blood pressure is back into normal range.  Symptoms are  likely viral in nature.  Follow up with pmd. Will prescribe zofran for management of nausea.   Strict return.      Final Clinical Impression(s) / ED Diagnoses Final diagnoses:  Nausea and vomiting, unspecified vomiting type  Primary hypertension  Other fatigue   Return for intractable cough, coughing up blood, fevers > 100.4 unrelieved by medication, shortness of breath, intractable vomiting, chest pain, shortness of breath, weakness, numbness, changes in speech, facial asymmetry, abdominal pain, passing out, Inability to tolerate liquids or food, cough, altered mental status or any concerns. No signs of systemic illness or infection. The patient is nontoxic-appearing on exam and vital signs are within normal limits.  I have reviewed the triage vital signs and the nursing notes. Pertinent labs & imaging results that were available during my care of the patient were reviewed by me and considered in my medical decision making (see chart for details). After history, exam, and medical workup I feel the patient has been appropriately medically screened and is safe for discharge home. Pertinent diagnoses were discussed with the patient. Patient was given return precautions.  Rx / DC Orders ED Discharge Orders          Ordered    ondansetron (ZOFRAN-ODT) 8 MG disintegrating tablet        03/22/22 0051              Malayla Granberry, [place], MD 03/22/22 0129

## 2022-09-25 ENCOUNTER — Other Ambulatory Visit: Payer: Self-pay | Admitting: Family Medicine

## 2022-09-25 DIAGNOSIS — Z1231 Encounter for screening mammogram for malignant neoplasm of breast: Secondary | ICD-10-CM

## 2023-01-21 ENCOUNTER — Ambulatory Visit: Payer: BC Managed Care – PPO | Admitting: Orthopedic Surgery
